# Patient Record
Sex: Female | Born: 1980 | Race: Black or African American | Hispanic: No | Marital: Married | Smoking: Former smoker
Health system: Southern US, Community
[De-identification: ages and names within clinical notes are randomized; demographics above are authoritative.]

## PROBLEM LIST (undated history)

## (undated) DIAGNOSIS — R519 Headache, unspecified: Secondary | ICD-10-CM

## (undated) DIAGNOSIS — D649 Anemia, unspecified: Secondary | ICD-10-CM

## (undated) DIAGNOSIS — R51 Headache: Secondary | ICD-10-CM

## (undated) HISTORY — DX: Anemia, unspecified: D64.9

## (undated) HISTORY — PX: HAND DEBRIDEMENT: SHX974

---

## 2010-03-01 ENCOUNTER — Emergency Department (HOSPITAL_COMMUNITY)
Admission: EM | Admit: 2010-03-01 | Discharge: 2010-03-01 | Disposition: A | Discharge status: Home / Self Care | Payer: Medicaid Other | Attending: Emergency Medicine | Admitting: Emergency Medicine

## 2010-03-01 DIAGNOSIS — R112 Nausea with vomiting, unspecified: Secondary | ICD-10-CM | POA: Insufficient documentation

## 2010-03-01 DIAGNOSIS — A5901 Trichomonal vulvovaginitis: Secondary | ICD-10-CM | POA: Insufficient documentation

## 2010-03-01 DIAGNOSIS — O99891 Other specified diseases and conditions complicating pregnancy: Secondary | ICD-10-CM | POA: Insufficient documentation

## 2010-03-01 LAB — URINALYSIS, ROUTINE W REFLEX MICROSCOPIC
Bilirubin Urine: NEGATIVE
Ketones, ur: NEGATIVE mg/dL
Nitrite: NEGATIVE
Urobilinogen, UA: 0.2 mg/dL (ref 0.0–1.0)
pH: 6 (ref 5.0–8.0)

## 2010-03-01 LAB — POCT I-STAT, CHEM 8
Calcium, Ion: 1.13 mmol/L (ref 1.12–1.32)
Chloride: 105 mEq/L (ref 96–112)
Glucose, Bld: 65 mg/dL — ABNORMAL LOW (ref 70–99)
HCT: 40 % (ref 36.0–46.0)
Hemoglobin: 13.6 g/dL (ref 12.0–15.0)
Potassium: 3.4 mEq/L — ABNORMAL LOW (ref 3.5–5.1)

## 2010-03-01 LAB — WET PREP, GENITAL
Clue Cells Wet Prep HPF POC: NONE SEEN
Yeast Wet Prep HPF POC: NONE SEEN

## 2010-03-01 LAB — URINE MICROSCOPIC-ADD ON

## 2010-03-01 LAB — POCT PREGNANCY, URINE: Preg Test, Ur: POSITIVE

## 2010-03-02 LAB — URINE CULTURE

## 2010-03-02 LAB — GC/CHLAMYDIA PROBE AMP, GENITAL
Chlamydia, DNA Probe: NEGATIVE
GC Probe Amp, Genital: NEGATIVE

## 2010-03-14 ENCOUNTER — Emergency Department (HOSPITAL_COMMUNITY)
Admission: EM | Admit: 2010-03-14 | Discharge: 2010-03-15 | Disposition: A | Discharge status: Home / Self Care | Payer: Medicaid Other | Attending: Emergency Medicine | Admitting: Emergency Medicine

## 2010-03-14 DIAGNOSIS — O219 Vomiting of pregnancy, unspecified: Secondary | ICD-10-CM | POA: Insufficient documentation

## 2010-03-15 LAB — URINE MICROSCOPIC-ADD ON

## 2010-03-15 LAB — POCT I-STAT, CHEM 8
BUN: <3 mg/dL — ABNORMAL LOW (ref 6–23)
Calcium, Ion: 1.25 mmol/L (ref 1.12–1.32)
Chloride: 103 mEq/L (ref 96–112)
Creatinine, Ser: 0.9 mg/dL (ref 0.4–1.2)
Glucose, Bld: 88 mg/dL (ref 70–99)
HCT: 38 % (ref 36.0–46.0)
Hemoglobin: 12.9 g/dL (ref 12.0–15.0)
Potassium: 3.9 mEq/L (ref 3.5–5.1)
Sodium: 137 mEq/L (ref 135–145)
TCO2: 22 mmol/L (ref 0–100)

## 2010-03-15 LAB — URINALYSIS, ROUTINE W REFLEX MICROSCOPIC
Bilirubin Urine: NEGATIVE
Glucose, UA: NEGATIVE mg/dL
Hgb urine dipstick: NEGATIVE
Ketones, ur: NEGATIVE mg/dL
Nitrite: NEGATIVE
Protein, ur: 30 mg/dL — AB
Specific Gravity, Urine: 1.023 (ref 1.005–1.030)
Urobilinogen, UA: 1 mg/dL (ref 0.0–1.0)
pH: 7 (ref 5.0–8.0)

## 2010-03-15 LAB — HCG, QUANTITATIVE, PREGNANCY: hCG, Beta Chain, Quant, S: 151827 m[IU]/mL — ABNORMAL HIGH (ref <5)

## 2010-05-12 LAB — ANTIBODY SCREEN: Antibody Screen: NEGATIVE

## 2010-05-12 LAB — HIV ANTIBODY (ROUTINE TESTING W REFLEX): HIV: NONREACTIVE

## 2010-05-12 LAB — RUBELLA ANTIBODY, IGM: Rubella: IMMUNE

## 2010-05-12 LAB — ABO/RH

## 2010-05-17 LAB — STREP B DNA PROBE: GBS: NEGATIVE

## 2010-06-03 ENCOUNTER — Emergency Department (HOSPITAL_COMMUNITY)
Admission: EM | Admit: 2010-06-03 | Discharge: 2010-06-03 | Disposition: A | Discharge status: Home / Self Care | Payer: Medicaid Other | Attending: Emergency Medicine | Admitting: Emergency Medicine

## 2010-06-03 DIAGNOSIS — O9989 Other specified diseases and conditions complicating pregnancy, childbirth and the puerperium: Secondary | ICD-10-CM | POA: Insufficient documentation

## 2010-06-03 DIAGNOSIS — R1013 Epigastric pain: Secondary | ICD-10-CM | POA: Insufficient documentation

## 2010-06-03 LAB — URINALYSIS, ROUTINE W REFLEX MICROSCOPIC
Bilirubin Urine: NEGATIVE
Glucose, UA: NEGATIVE mg/dL
Hgb urine dipstick: NEGATIVE
Protein, ur: NEGATIVE mg/dL
Specific Gravity, Urine: 1.007 (ref 1.005–1.030)

## 2010-10-13 NOTE — H&P (Signed)
[name], [name]           ACCOUNT NO.:  [id number]  MEDICAL RECORD NO.:  [id number]  LOCATION:  [facility]                         FACILITY:  [facility]  PHYSICIAN:  Janine Limbo, M.D.DATE OF BIRTH:  Apr 26, 1980  DATE OF ADMISSION:  06/03/2010 DATE OF DISCHARGE:  06/03/2010                             HISTORY & PHYSICAL   HISTORY OF PRESENT ILLNESS:  [name] is a 30 year old female, gravida 4, para 3-0-0-3, who presents at 40 weeks and 2-day gestation (EDC is October 12, 2010).  The patient has been followed at the [facility] of [facility] for women.  The pregnancy has been largely uncomplicated.  She presents for induction of labor.  OBSTETRICAL HISTORY:  The patient has had three vaginal deliveries at term.  Her heaviest infant weighed 7 pounds and 3 ounces.  DRUG ALLERGIES:  No known drug allergies.  PAST MEDICAL HISTORY:  The patient has a history of varicose veins.  She has an increased body mass index.  She has a history of having surgery on her hand where a splinter was embedded deep in her nerve and tissue.  SOCIAL HISTORY:  The patient is a past cigarette smoker, but she stopped smoking when she found out that she was pregnant.  She denies alcohol use and other recreational drug uses.  REVIEW OF SYSTEMS:  Normal pregnancy complaints.  FAMILY HISTORY:  Noncontributory.  PHYSICAL EXAMINATION:  VITAL SIGNS:  Height is 5 feet 6 inches, weight is 199 pounds. HEENT: Within normal limits. CHEST:  Clear. HEART:  Regular rate and rhythm. CHEST:  Her breasts are without masses. ABDOMEN:  Gravid with fundal height of 39 cm. EXTREMITIES:  Grossly normal. NEUROLOGIC:  Grossly normal. PELVIC:  Cervix is 2-3 cm dilated, 50% effaced, and -2 in station.  LABORATORY VALUES:  Blood type is O+, antibody screen negative, rubella is immune.  HBsAg  is negative.  HIV is nonreactive.  Sickle cell is negative.  Third trimester  beta strep is negative.  ASSESSMENT: 1. A 40-week and 2-day gestation. 2. Maternal fatigue with desire for induction of labor. 3. Multiparous patient.  PLAN:  The patient will undergo induction of labor.  She understands the indications for her procedure as well as the alternative treatment options.  We will give the patient Pitocin and plan to rupture her membranes once she starts to labor.     Janine Limbo, M.D.     AVS/MEDQ  D:  10/13/2010  T:  10/13/2010  Job:  [phone] [phone]

## 2010-10-14 ENCOUNTER — Encounter (HOSPITAL_COMMUNITY): Payer: Self-pay | Admitting: Anesthesiology

## 2010-10-14 ENCOUNTER — Inpatient Hospital Stay (HOSPITAL_COMMUNITY)
Admission: RE | Admit: 2010-10-14 | Discharge: 2010-10-16 | DRG: 775 | Disposition: A | Discharge status: Home / Self Care | Payer: Medicaid Other | Source: Ambulatory Visit | Attending: Obstetrics and Gynecology | Admitting: Obstetrics and Gynecology

## 2010-10-14 ENCOUNTER — Encounter (HOSPITAL_COMMUNITY): Payer: Self-pay

## 2010-10-14 ENCOUNTER — Inpatient Hospital Stay (HOSPITAL_COMMUNITY): Payer: Medicaid Other | Admitting: Anesthesiology

## 2010-10-14 DIAGNOSIS — O9903 Anemia complicating the puerperium: Secondary | ICD-10-CM | POA: Diagnosis not present

## 2010-10-14 DIAGNOSIS — O480 Post-term pregnancy: Principal | ICD-10-CM | POA: Diagnosis present

## 2010-10-14 DIAGNOSIS — D649 Anemia, unspecified: Secondary | ICD-10-CM | POA: Diagnosis not present

## 2010-10-14 LAB — CBC
HCT: 30.9 % — ABNORMAL LOW (ref 36.0–46.0)
Hemoglobin: 10.2 g/dL — ABNORMAL LOW (ref 12.0–15.0)
MCH: 28.9 pg (ref 26.0–34.0)
MCHC: 33 g/dL (ref 30.0–36.0)
MCV: 87.5 fL (ref 78.0–100.0)
Platelets: 170 10*3/uL (ref 150–400)
RBC: 3.53 MIL/uL — ABNORMAL LOW (ref 3.87–5.11)
RDW: 13.6 % (ref 11.5–15.5)
WBC: 5.8 10*3/uL (ref 4.0–10.5)

## 2010-10-14 MED ORDER — IBUPROFEN 600 MG PO TABS: 600.0000 mg | ORAL_TABLET | Freq: Four times a day (QID) | ORAL | Status: DC | PRN

## 2010-10-14 MED ORDER — EPHEDRINE 5 MG/ML INJ
10.0000 mg | INTRAVENOUS | Status: DC | PRN
  Filled 2010-10-14: qty 4

## 2010-10-14 MED ORDER — LACTATED RINGERS IV SOLN
INTRAVENOUS | Status: DC
  Administered 2010-10-14: 125 mL/h via INTRAVENOUS
  Administered 2010-10-14: 22:00:00 via INTRAVENOUS

## 2010-10-14 MED ORDER — ONDANSETRON HCL 4 MG/2ML IJ SOLN: 4.0000 mg | Freq: Four times a day (QID) | INTRAMUSCULAR | Status: DC | PRN

## 2010-10-14 MED ORDER — OXYTOCIN 20 UNITS IN LACTATED RINGERS INFUSION - SIMPLE
125.0000 mL/h | Freq: Once | INTRAVENOUS | Status: DC
  Administered 2010-10-14: 125 mL/h via INTRAVENOUS
  Filled 2010-10-14: qty 1000

## 2010-10-14 MED ORDER — EPHEDRINE 5 MG/ML INJ
10.0000 mg | INTRAVENOUS | Status: DC | PRN
  Filled 2010-10-14 (×2): qty 4

## 2010-10-14 MED ORDER — OXYTOCIN BOLUS FROM INFUSION
500.0000 mL | Freq: Once | INTRAVENOUS | Status: DC
  Filled 2010-10-14: qty 500

## 2010-10-14 MED ORDER — LIDOCAINE HCL (PF) 1 % IJ SOLN
30.0000 mL | INTRAMUSCULAR | Status: DC | PRN
  Filled 2010-10-14 (×2): qty 30

## 2010-10-14 MED ORDER — OXYCODONE-ACETAMINOPHEN 5-325 MG PO TABS: 2.0000 | ORAL_TABLET | ORAL | Status: DC | PRN

## 2010-10-14 MED ORDER — LIDOCAINE HCL 1.5 % IJ SOLN
INTRAMUSCULAR | Status: DC | PRN
  Administered 2010-10-14 (×2): 5 mL via EPIDURAL

## 2010-10-14 MED ORDER — PHENYLEPHRINE 40 MCG/ML (10ML) SYRINGE FOR IV PUSH (FOR BLOOD PRESSURE SUPPORT)
80.0000 ug | PREFILLED_SYRINGE | INTRAVENOUS | Status: DC | PRN
  Filled 2010-10-14 (×2): qty 5

## 2010-10-14 MED ORDER — ACETAMINOPHEN 325 MG PO TABS: 650.0000 mg | ORAL_TABLET | ORAL | Status: DC | PRN

## 2010-10-14 MED ORDER — PHENYLEPHRINE 40 MCG/ML (10ML) SYRINGE FOR IV PUSH (FOR BLOOD PRESSURE SUPPORT)
80.0000 ug | PREFILLED_SYRINGE | INTRAVENOUS | Status: DC | PRN
  Filled 2010-10-14: qty 5

## 2010-10-14 MED ORDER — DIPHENHYDRAMINE HCL 50 MG/ML IJ SOLN: 12.5000 mg | INTRAMUSCULAR | Status: DC | PRN

## 2010-10-14 MED ORDER — LACTATED RINGERS IV SOLN: INTRAVENOUS | Status: DC

## 2010-10-14 MED ORDER — FENTANYL 2.5 MCG/ML BUPIVACAINE 1/10 % EPIDURAL INFUSION (WH - ANES)
14.0000 mL/h | INTRAMUSCULAR | Status: DC
  Administered 2010-10-14: 14 mL/h via EPIDURAL
  Filled 2010-10-14: qty 60

## 2010-10-14 MED ORDER — CITRIC ACID-SODIUM CITRATE 334-500 MG/5ML PO SOLN: 30.0000 mL | ORAL | Status: DC | PRN

## 2010-10-14 MED ORDER — LACTATED RINGERS IV SOLN
500.0000 mL | INTRAVENOUS | Status: DC | PRN
  Administered 2010-10-14: 500 mL via INTRAVENOUS

## 2010-10-14 MED ORDER — FLEET ENEMA 7-19 GM/118ML RE ENEM: 1.0000 | ENEMA | RECTAL | Status: DC | PRN

## 2010-10-14 MED ORDER — LACTATED RINGERS IV SOLN
500.0000 mL | Freq: Once | INTRAVENOUS | Status: AC
  Administered 2010-10-14: 500 mL via INTRAVENOUS

## 2010-10-14 NOTE — Progress Notes (Signed)
Cx: 2-3/75/-1 FHT: Cat 1 Pt wants epidural. Then will ROM.  Leonard Schwartz, M.D.

## 2010-10-14 NOTE — ED Notes (Signed)
Offered to let pt walk in hall pt declinded at this time.

## 2010-10-14 NOTE — Anesthesia Preprocedure Evaluation (Signed)
Anesthesia Evaluation  Name, MR# and DOB Patient awake  General Assessment Comment  Reviewed: Allergy & Precautions, H&P , Patient's Chart, lab work & pertinent test results  Airway Mallampati: II TM Distance: >3 FB Neck ROM: full    Dental No notable dental hx.    Pulmonary  clear to auscultation  Pulmonary exam normal       Cardiovascular regular Normal    Neuro/Psych Negative Neurological ROS  Negative Psych ROS   GI/Hepatic negative GI ROS Neg liver ROS    Endo/Other  Negative Endocrine ROS  Renal/GU negative Renal ROS     Musculoskeletal   Abdominal   Peds  Hematology negative hematology ROS (+)   Anesthesia Other Findings   Reproductive/Obstetrics (+) Pregnancy                           Anesthesia Physical Anesthesia Plan  ASA: II  Anesthesia Plan: Epidural   Post-op Pain Management:    Induction:   Airway Management Planned:   Additional Equipment:   Intra-op Plan:   Post-operative Plan:   Informed Consent: I have reviewed the patients History and Physical, chart, labs and discussed the procedure including the risks, benefits and alternatives for the proposed anesthesia with the patient or authorized representative who has indicated his/her understanding and acceptance.     Plan Discussed with:   Anesthesia Plan Comments:         Anesthesia Quick Evaluation  

## 2010-10-14 NOTE — Op Note (Signed)
Delivery Note At 10:38 PM a viable and healthy female (heaven) was delivered via Vaginal, Spontaneous Delivery (Presentation: Left Occiput Anterior).  APGAR: 9, 9; weight 8 lb 6.2 oz (3805 g).   Placenta status: Intact, Spontaneous.  Cord: 3 vessels with the following complications: None.  Anesthesia: Epidural  Episiotomy: None Lacerations: None Suture Repair: NA Est. Blood Loss (mL): 300   Mom to postpartum.   Baby A to nursery-stable.   Baby B to NA.  Kirkland Hun V 10/14/2010, 10:57 PM

## 2010-10-14 NOTE — ED Notes (Signed)
Pt request to walk now.. Pt off monito to walk in hall x 1 hr. Notified CNM that pt is walking

## 2010-10-14 NOTE — Progress Notes (Signed)
Notified FHR tracing is reative. Pt reports ctxs feel like they are slowwing down. Order to let pt walk in hall if she wanted.

## 2010-10-14 NOTE — Anesthesia Procedure Notes (Signed)

## 2010-10-14 NOTE — Progress Notes (Signed)
S: pt c/o 8-45min ctx that are getting stronger, denies need for pain meds at this time, pt is sched for IOL today 2nd to PD O: vss, FHR 130, not reactive at this time, minimal variability, no decels CAT II toco - not tracing well SVE 2/50/-2  A:  Term IUP awaiting IOL Probable early labor Non reactive NST, overall reassuring  P: CTO Consider IVF's for FHR if necessary DR Stefano Gaul updated

## 2010-10-14 NOTE — Progress Notes (Signed)
Patient is comfortable with epidural. FHT Cat 1 Cx 5/80/0, vertex AROM clear  Leonard Schwartz, MD

## 2010-10-14 NOTE — ED Notes (Signed)
Pt back from walking. L&D called and said there was a room available for her. Pt admittted to room 169

## 2010-10-14 NOTE — Progress Notes (Signed)
Pt reports started feeling ctx around 8am . Coming q 10 min. Due for induction today.

## 2010-10-15 LAB — CBC
HCT: 29.3 % — ABNORMAL LOW (ref 36.0–46.0)
Hemoglobin: 9.7 g/dL — ABNORMAL LOW (ref 12.0–15.0)
MCH: 29.1 pg (ref 26.0–34.0)
MCV: 88 fL (ref 78.0–100.0)
RBC: 3.33 MIL/uL — ABNORMAL LOW (ref 3.87–5.11)

## 2010-10-15 LAB — RPR: RPR Ser Ql: NONREACTIVE

## 2010-10-15 LAB — ABO/RH: ABO/RH(D): O POS

## 2010-10-15 MED ORDER — TETANUS-DIPHTH-ACELL PERTUSSIS 5-2.5-18.5 LF-MCG/0.5 IM SUSP: 0.5000 mL | Freq: Once | INTRAMUSCULAR | Status: DC

## 2010-10-15 MED ORDER — DIPHENHYDRAMINE HCL 25 MG PO CAPS: 25.0000 mg | ORAL_CAPSULE | Freq: Four times a day (QID) | ORAL | Status: DC | PRN

## 2010-10-15 MED ORDER — MEDROXYPROGESTERONE ACETATE 150 MG/ML IM SUSP: 150.0000 mg | INTRAMUSCULAR | Status: DC | PRN

## 2010-10-15 MED ORDER — MEASLES, MUMPS & RUBELLA VAC ~~LOC~~ INJ
0.5000 mL | INJECTION | Freq: Once | SUBCUTANEOUS | Status: DC
  Filled 2010-10-15: qty 0.5

## 2010-10-15 MED ORDER — ONDANSETRON HCL 4 MG PO TABS: 4.0000 mg | ORAL_TABLET | ORAL | Status: DC | PRN

## 2010-10-15 MED ORDER — IBUPROFEN 600 MG PO TABS
600.0000 mg | ORAL_TABLET | Freq: Four times a day (QID) | ORAL | Status: DC
  Administered 2010-10-15 – 2010-10-16 (×5): 600 mg via ORAL
  Filled 2010-10-15 (×5): qty 1

## 2010-10-15 MED ORDER — ONDANSETRON HCL 4 MG/2ML IJ SOLN: 4.0000 mg | INTRAMUSCULAR | Status: DC | PRN

## 2010-10-15 MED ORDER — SIMETHICONE 80 MG PO CHEW: 80.0000 mg | CHEWABLE_TABLET | ORAL | Status: DC | PRN

## 2010-10-15 MED ORDER — BENZOCAINE-MENTHOL 20-0.5 % EX AERO: 1.0000 "application " | INHALATION_SPRAY | CUTANEOUS | Status: DC | PRN

## 2010-10-15 MED ORDER — PRENATAL PLUS 27-1 MG PO TABS
1.0000 | ORAL_TABLET | Freq: Every day | ORAL | Status: DC
  Administered 2010-10-15 – 2010-10-16 (×2): 1 via ORAL
  Filled 2010-10-15 (×2): qty 1

## 2010-10-15 MED ORDER — LANOLIN HYDROUS EX OINT: TOPICAL_OINTMENT | CUTANEOUS | Status: DC | PRN

## 2010-10-15 MED ORDER — WITCH HAZEL-GLYCERIN EX PADS: 1.0000 "application " | MEDICATED_PAD | CUTANEOUS | Status: DC | PRN

## 2010-10-15 MED ORDER — DIBUCAINE 1 % RE OINT: 1.0000 "application " | TOPICAL_OINTMENT | RECTAL | Status: DC | PRN

## 2010-10-15 MED ORDER — OXYCODONE-ACETAMINOPHEN 5-325 MG PO TABS
1.0000 | ORAL_TABLET | ORAL | Status: DC | PRN
  Administered 2010-10-15: 1 via ORAL
  Filled 2010-10-15: qty 1

## 2010-10-15 MED ORDER — ZOLPIDEM TARTRATE 5 MG PO TABS: 5.0000 mg | ORAL_TABLET | Freq: Every evening | ORAL | Status: DC | PRN

## 2010-10-15 MED ORDER — SENNOSIDES-DOCUSATE SODIUM 8.6-50 MG PO TABS
2.0000 | ORAL_TABLET | Freq: Every day | ORAL | Status: DC
Start: 2010-10-15 — End: 2010-10-16
  Administered 2010-10-15: 2 via ORAL

## 2010-10-15 NOTE — Progress Notes (Signed)
Post Partum Day 1 Subjective: no complaints.  Up ad lib.  Undecided re: contraception.  Bottlefeeding.  Objective: Blood pressure 101/62, pulse 71, temperature 97.6 F (36.4 C), temperature source Oral, resp. rate 16, height 5\' 7"  (1.702 m), weight 91.445 kg (201 lb 9.6 oz), unknown if currently breastfeeding.  Physical Exam:  General: alert Lochia: appropriate Uterine Fundus: firm Incision: Perineum clear DVT Evaluation: No evidence of DVT seen on physical exam. Negative Homan's sign.   Basename 10/15/10 0540 10/14/10 1730  HGB 9.7* 10.2*  HCT 29.3* 30.9*    Assessment/Plan: Plan for discharge tomorrow   LOS: 1 day   Theresa Banks 10/15/2010, 8:16 AM

## 2010-10-15 NOTE — Anesthesia Postprocedure Evaluation (Signed)
Anesthesia Post Note  Patient: Theresa Banks  Procedure(s) Performed: * No procedures listed *  Anesthesia type: Epidural  Patient location: Mother/Baby  Post pain: Pain level controlled  Post assessment: Post-op Vital signs reviewed  Last Vitals:  Filed Vitals:   10/15/10 0617  BP: 101/62  Pulse: 71  Temp: 97.6 F (36.4 C)  Resp: 16    Post vital signs: Reviewed  Level of consciousness: awake  Complications: No apparent anesthesia complications

## 2010-10-15 NOTE — Addendum Note (Signed)
Addendum  created 10/15/10 1600 by Veanna Dower   Modules edited:Charges VN, Notes Section    

## 2010-10-15 NOTE — Anesthesia Postprocedure Evaluation (Signed)
  Anesthesia Post-op Note  Patient: Theresa Banks  Procedure(s) Performed: * No procedures listed *  Patient Location: PACU and Mother/Baby  Anesthesia Type: Epidural  Level of Consciousness: awake, alert  and oriented  Airway and Oxygen Therapy: Patient Spontanous Breathing  Post-op Pain: none  Post-op Assessment: Post-op Vital signs reviewed and Patient's Cardiovascular Status Stable  Post-op Vital Signs: Reviewed and stable  Complications: No apparent anesthesia complications

## 2010-10-15 NOTE — Addendum Note (Signed)
Addendum  created 10/15/10 1600 by Cephus Shelling   Modules edited:Charges VN, Notes Section

## 2010-10-15 NOTE — Progress Notes (Signed)
UR chart review completed.  

## 2010-10-16 MED ORDER — FERROUS SULFATE 325 (65 FE) MG PO TABS: 325.0000 mg | ORAL_TABLET | Freq: Every day | ORAL | Status: DC

## 2010-10-16 MED ORDER — IBUPROFEN 600 MG PO TABS: 600.0000 mg | ORAL_TABLET | Freq: Four times a day (QID) | ORAL | Status: AC

## 2010-10-16 MED ORDER — OXYCODONE-ACETAMINOPHEN 5-325 MG PO TABS: 1.0000 | ORAL_TABLET | Freq: Four times a day (QID) | ORAL | Status: AC | PRN

## 2010-10-16 NOTE — Progress Notes (Signed)
Post Partum Day 2 Subjective: no complaints.  Pt reports she is ambulating, voiding and tolerating po liquids and solids without difficulty.  No BM but passing flatus.  Is bottle feeding.  Denies dizziness or weakness.  Having some cramping with some relief with motrin but has used Percocet for improved results.    Objective: Blood pressure 142/79, pulse 89, temperature 98.4 F (36.9 C), temperature source Oral, resp. rate 18, height 5\' 7"  (1.702 m), weight 91.445 kg (201 lb 9.6 oz), unknown if currently breastfeeding. Filed Vitals:   10/15/10 0617 10/15/10 1348 10/15/10 2129 10/16/10 0604  BP: 101/62 114/70 122/72 142/79  Pulse: 71 73 73 89  Temp: 97.6 F (36.4 C) 98.1 F (36.7 C) 97.5 F (36.4 C) 98.4 F (36.9 C)  TempSrc: Oral Oral Oral Oral  Resp: 16 20 18 18   Height:      Weight:       Physical Exam:  General: alert, cooperative and no distress Heart:  RRR Lungs:  Clear to a/p auscultation Breasts:  Soft Abdomen:  Soft, nontender with positive BS x 4 quads Lochia: appropriate Uterine Fundus: firm, 1 below umbilicus Incision: N/A DVT Evaluation: No evidence of DVT seen on physical exam. Negative Homan's sign bilaterally. No significant calf/ankle edema.   Basename 10/15/10 0540 10/14/10 1730  HGB 9.7* 10.2*  HCT 29.3* 30.9*    Assessment/Plan: Stable s/p vaginal delivery Asymptomatic postpartum anemia  Discharge to home Rx Motrin, Percocet, Iron Discharge instructions reviewed. Pt plans Implanon for contraception. RTO 6wks for f/u.       LOS: 2 days   Tab Rylee O. 10/16/2010, 9:12 AM

## 2010-10-16 NOTE — Discharge Summary (Signed)
Obstetric Discharge Summary Reason for Admission: onset of labor Prenatal Procedures: ultrasound Intrapartum Procedures: spontaneous vaginal delivery Postpartum Procedures: none Complications-Operative and Postpartum: none Hemoglobin  Date Value Range Status  10/15/2010 9.7* 12.0-15.0 (g/dL) Final     HCT  Date Value Range Status  10/15/2010 29.3* 36.0-46.0 (%) Final    Discharge Diagnoses: Term Pregnancy-delivered  Discharge Information: Date: 10/16/2010 Activity: unrestricted Diet: routine Medications: PNV, Ibuprofen, Iron and Percocet Condition: stable Instructions: refer to practice specific booklet Discharge to: home Contraception:  Desires Implanon Follow-up Information    Follow up with [facility] in 6 weeks.         Newborn Data: Live born female  Birth Weight: 8 lb 6.2 oz (3805 g) APGAR: 9, 9  Home with mother.  Theresa Fitzpatrick O. 10/16/2010, 9:22 AM

## 2012-02-25 ENCOUNTER — Emergency Department (HOSPITAL_COMMUNITY)
Admission: EM | Admit: 2012-02-25 | Discharge: 2012-02-25 | Disposition: A | Discharge status: Home / Self Care | Payer: Self-pay | Attending: Emergency Medicine | Admitting: Emergency Medicine

## 2012-02-25 ENCOUNTER — Encounter (HOSPITAL_COMMUNITY): Payer: Self-pay | Admitting: *Deleted

## 2012-02-25 DIAGNOSIS — J029 Acute pharyngitis, unspecified: Secondary | ICD-10-CM | POA: Insufficient documentation

## 2012-02-25 DIAGNOSIS — R0982 Postnasal drip: Secondary | ICD-10-CM | POA: Insufficient documentation

## 2012-02-25 DIAGNOSIS — R059 Cough, unspecified: Secondary | ICD-10-CM | POA: Insufficient documentation

## 2012-02-25 DIAGNOSIS — J0100 Acute maxillary sinusitis, unspecified: Secondary | ICD-10-CM | POA: Insufficient documentation

## 2012-02-25 DIAGNOSIS — R05 Cough: Secondary | ICD-10-CM | POA: Insufficient documentation

## 2012-02-25 DIAGNOSIS — R509 Fever, unspecified: Secondary | ICD-10-CM | POA: Insufficient documentation

## 2012-02-25 MED ORDER — CETIRIZINE-PSEUDOEPHEDRINE ER 5-120 MG PO TB12: 1.0000 | ORAL_TABLET | Freq: Two times a day (BID) | ORAL | Status: DC

## 2012-02-25 MED ORDER — AMOXICILLIN 500 MG PO CAPS: 500.0000 mg | ORAL_CAPSULE | Freq: Three times a day (TID) | ORAL | Status: DC

## 2012-02-25 MED ORDER — PROMETHAZINE-CODEINE 6.25-10 MG/5ML PO SYRP: 5.0000 mL | ORAL_SOLUTION | ORAL | Status: DC | PRN

## 2012-02-25 NOTE — ED Provider Notes (Signed)
History     CSN: [id number]  Arrival date & time 02/25/12  6962   First MD Initiated Contact with Patient 02/25/12 [phone]      Chief Complaint  Patient presents with  . URI    (Consider location/radiation/quality/duration/timing/severity/associated sxs/prior treatment) HPI Comments: Theresa Banks is a 32 y.o. Female presenting with a 5 day history of uri type symptoms including nasal congestion and rhinorrhea,  Post nasal drip,  Sore throat,  Hoarse voice and coughing which is sometimes productive of green sputum.  She has been drinking tea and taking mucinex with no relief of symtpoms.  Yesterday she developed chills and subjective fever and aching pain in her right cheek and right upper teeth.   She denies shortness of breath,  Chest pain and headache.     The history is provided by the patient.    History reviewed. No pertinent past medical history.  Past Surgical History  Procedure Laterality Date  . Hand debridement      History reviewed. No pertinent family history.  History  Substance Use Topics  . Smoking status: Never Smoker   . Smokeless tobacco: Not on file  . Alcohol Use: No    OB History   Grav Para Term Preterm Abortions TAB SAB Ect Mult Living   4 4 4  0 0 0 0  0 4      Review of Systems  Constitutional: Positive for fever and chills.  HENT: Positive for congestion, sore throat, rhinorrhea, postnasal drip and sinus pressure. Negative for neck pain.   Eyes: Negative.   Respiratory: Positive for cough. Negative for chest tightness and shortness of breath.   Cardiovascular: Negative for chest pain.  Gastrointestinal: Negative for nausea and abdominal pain.  Genitourinary: Negative.   Musculoskeletal: Negative for joint swelling and arthralgias.  Skin: Negative.  Negative for rash and wound.  Neurological: Negative for dizziness, weakness, light-headedness, numbness and headaches.  Psychiatric/Behavioral: Negative.     Allergies  Review of  patient's allergies indicates no known allergies.  Home Medications   Current Outpatient Rx  Name  Route  Sig  Dispense  Refill  . GuaiFENesin (MUCINEX PO)   Oral   Take 15 mLs by mouth 3 (three) times daily as needed (cough & congestion).         Marland Kitchen amoxicillin (AMOXIL) 500 MG capsule   Oral   Take 1 capsule (500 mg total) by mouth 3 (three) times daily.   30 capsule   0   . cetirizine-pseudoephedrine (ZYRTEC-D) 5-120 MG per tablet   Oral   Take 1 tablet by mouth 2 (two) times daily.   14 tablet   0   . promethazine-codeine (PHENERGAN WITH CODEINE) 6.25-10 MG/5ML syrup   Oral   Take 5 mLs by mouth every 4 (four) hours as needed for cough.   120 mL   0     BP 131/76  Pulse 78  Temp(Src) 98.6 F (37 C) (Oral)  Resp 20  SpO2 98%  LMP 01/04/2012  Physical Exam  Constitutional: She is oriented to person, place, and time. She appears well-developed and well-nourished.  HENT:  Head: Normocephalic and atraumatic.  Right Ear: Tympanic membrane and ear canal normal.  Left Ear: Tympanic membrane and ear canal normal.  Nose: Mucosal edema and rhinorrhea present. Right sinus exhibits maxillary sinus tenderness.  Mouth/Throat: Uvula is midline, oropharynx is clear and moist and mucous membranes are normal. No oropharyngeal exudate, posterior oropharyngeal edema, posterior oropharyngeal erythema or tonsillar abscesses.  Eyes: Conjunctivae are normal.  Cardiovascular: Normal rate and normal heart sounds.   Pulmonary/Chest: Effort normal. No respiratory distress. She has no wheezes. She has no rales. She exhibits no tenderness.  Abdominal: Soft. There is no tenderness.  Musculoskeletal: Normal range of motion.  Neurological: She is alert and oriented to person, place, and time.  Skin: Skin is warm and dry. No rash noted.  Psychiatric: She has a normal mood and affect.    ED Course  Procedures (including critical care time)  Labs Reviewed - No data to display No results  found.   1. Sinusitis, acute maxillary       MDM  URI sx which have evolved to right maxillary sinusitis. Pt encouraged rest, increase fluid intake and continue mucinex.  Amoxil prescribed,  Zyrtec D for congestion and drainage,  Phenergan with codeine for cough and rest.  Encouraged recheck for worsened sx.  Referrals given.        Burgess Amor, PA 02/25/12 1012

## 2012-02-25 NOTE — ED Provider Notes (Signed)
Medical screening examination/treatment/procedure(s) were performed by non-physician practitioner and as supervising physician I was immediately available for consultation/collaboration.   Bransyn Adami, MD 02/25/12 1544 

## 2012-02-25 NOTE — ED Notes (Signed)
Pt reports cold symptoms since Tuesday. Sore throat, chills, cough, runny nose. No distress noted at triage.

## 2012-04-26 ENCOUNTER — Emergency Department (HOSPITAL_COMMUNITY)
Admission: EM | Admit: 2012-04-26 | Discharge: 2012-04-26 | Disposition: A | Discharge status: Home / Self Care | Payer: Self-pay | Attending: Emergency Medicine | Admitting: Emergency Medicine

## 2012-04-26 ENCOUNTER — Encounter (HOSPITAL_COMMUNITY): Payer: Self-pay | Admitting: Emergency Medicine

## 2012-04-26 DIAGNOSIS — R630 Anorexia: Secondary | ICD-10-CM | POA: Insufficient documentation

## 2012-04-26 DIAGNOSIS — J020 Streptococcal pharyngitis: Secondary | ICD-10-CM | POA: Insufficient documentation

## 2012-04-26 DIAGNOSIS — R1310 Dysphagia, unspecified: Secondary | ICD-10-CM | POA: Insufficient documentation

## 2012-04-26 DIAGNOSIS — R599 Enlarged lymph nodes, unspecified: Secondary | ICD-10-CM | POA: Insufficient documentation

## 2012-04-26 DIAGNOSIS — R509 Fever, unspecified: Secondary | ICD-10-CM | POA: Insufficient documentation

## 2012-04-26 MED ORDER — HYDROCODONE-ACETAMINOPHEN 5-325 MG PO TABS: 1.0000 | ORAL_TABLET | Freq: Four times a day (QID) | ORAL | Status: DC | PRN

## 2012-04-26 MED ORDER — PENICILLIN G BENZATHINE 1200000 UNIT/2ML IM SUSP
1.2000 10*6.[IU] | Freq: Once | INTRAMUSCULAR | Status: AC
  Administered 2012-04-26: 1.2 10*6.[IU] via INTRAMUSCULAR
  Filled 2012-04-26: qty 2

## 2012-04-26 MED ORDER — IBUPROFEN 400 MG PO TABS
600.0000 mg | ORAL_TABLET | Freq: Once | ORAL | Status: AC
  Administered 2012-04-26: 600 mg via ORAL
  Filled 2012-04-26: qty 1

## 2012-04-26 MED ORDER — DEXAMETHASONE SODIUM PHOSPHATE 10 MG/ML IJ SOLN
10.0000 mg | Freq: Once | INTRAMUSCULAR | Status: AC
  Administered 2012-04-26: 10 mg via INTRAMUSCULAR
  Filled 2012-04-26: qty 1

## 2012-04-26 NOTE — ED Provider Notes (Signed)
History     CSN: [id number]  Arrival date & time 04/26/12  0000   First MD Initiated Contact with Patient 04/26/12 0130      Chief Complaint  Patient presents with  . Sore Throat    (Consider location/radiation/quality/duration/timing/severity/associated sxs/prior treatment) Patient is a 32 y.o. female presenting with pharyngitis. The history is provided by the patient.  Sore Throat This is a new problem. The current episode started in the past 7 days (2 days ago). The problem occurs constantly. The problem has been gradually worsening. Associated symptoms include anorexia, a fever and a sore throat. Pertinent negatives include no abdominal pain, arthralgias, change in bowel habit, chest pain, chills, congestion, coughing, diaphoresis, fatigue, headaches, joint swelling, myalgias, nausea, neck pain, numbness, rash, swollen glands, urinary symptoms, vertigo, visual change, vomiting or weakness. The symptoms are aggravated by drinking, eating and swallowing. She has tried nothing for the symptoms. The treatment provided no relief.    History reviewed. No pertinent past medical history.  Past Surgical History  Procedure Laterality Date  . Hand debridement      No family history on file.  History  Substance Use Topics  . Smoking status: Never Smoker   . Smokeless tobacco: Not on file  . Alcohol Use: No    OB History   Grav Para Term Preterm Abortions TAB SAB Ect Mult Living   4 4 4  0 0 0 0  0 4      Review of Systems  Constitutional: Positive for fever and appetite change. Negative for chills, diaphoresis, activity change and fatigue.  HENT: Positive for sore throat and trouble swallowing. Negative for congestion, facial swelling, rhinorrhea, sneezing, drooling, neck pain, neck stiffness, dental problem, voice change, postnasal drip and sinus pressure.   Eyes: Negative for visual disturbance.  Respiratory: Negative for cough, choking, shortness of breath, wheezing and  stridor.   Cardiovascular: Negative for chest pain.  Gastrointestinal: Positive for anorexia. Negative for nausea, vomiting, abdominal pain and change in bowel habit.  Musculoskeletal: Negative for myalgias, joint swelling and arthralgias.  Skin: Negative for rash.  Neurological: Negative for dizziness, vertigo, weakness, numbness and headaches.  Hematological: Positive for adenopathy. Does not bruise/bleed easily.  All other systems reviewed and are negative.    Allergies  Review of patient's allergies indicates no known allergies.  Home Medications  No current outpatient prescriptions on file.  BP 138/77  Pulse 105  Temp(Src) 99 F (37.2 C) (Oral)  Resp 20  SpO2 98%  LMP 04/06/2012  Physical Exam  Nursing note and vitals reviewed. Constitutional: She is oriented to person, place, and time. She appears well-developed and well-nourished. No distress.  HENT:  Head: Normocephalic and atraumatic. No trismus in the jaw.  Right Ear: Tympanic membrane, external ear and ear canal normal.  Left Ear: Tympanic membrane, external ear and ear canal normal.  Nose: Nose normal. No rhinorrhea. Right sinus exhibits no maxillary sinus tenderness and no frontal sinus tenderness. Left sinus exhibits no maxillary sinus tenderness and no frontal sinus tenderness.  Mouth/Throat: Uvula is midline and mucous membranes are normal. Normal dentition. No dental abscesses or edematous. Oropharyngeal exudate and posterior oropharyngeal edema present. No posterior oropharyngeal erythema or tonsillar abscesses.  No submental edema, tongue not elevated, no trismus. No impending airway obstruction; Pt able to speak full sentences, swallow intact, no drooling, stridor, or tonsillar/uvula displacement. No palatal petechia  Eyes: Conjunctivae are normal.  Neck: Trachea normal, normal range of motion and full passive range of motion without  pain. Neck supple. No rigidity. Normal range of motion present. No  Brudzinski's sign noted.  Flexion and extension of neck without pain or difficulty. Able to breath without difficulty in extension.  Cardiovascular: Normal rate and regular rhythm.   Pulmonary/Chest: Effort normal and breath sounds normal. No stridor. No respiratory distress. She has no wheezes.  Abdominal: Soft. There is no tenderness.  No obvious evidence of splenomegaly. Non ttp.   Musculoskeletal: Normal range of motion.  Lymphadenopathy:       Head (right side): No preauricular and no posterior auricular adenopathy present.       Head (left side): No preauricular and no posterior auricular adenopathy present.    She has cervical adenopathy.  Neurological: She is alert and oriented to person, place, and time.  Skin: Skin is warm and dry. No rash noted. She is not diaphoretic.  Psychiatric: She has a normal mood and affect.    ED Course  Procedures (including critical care time)  Labs Reviewed  RAPID STREP SCREEN - Abnormal; Notable for the following:    Streptococcus, Group A Screen (Direct) POSITIVE (*)    All other components within normal limits   No results found.   No diagnosis found.    MDM  Pt febrile with tonsillar exudate, cervical lymphadenopathy, & dysphagia; diagnosis of strep. Treated in the Ed with steroids, NSAIDs, Pain medication and PCN IM.  Pt appears mildly dehydrated, discussed importance of water rehydration. Presentation non concerning for PTA or infxn spread to soft tissue. No trismus or uvula deviation. Specific return precautions discussed. Pt able to drink water in ED without difficulty with intact air way. Recommended PCP follow up.          Jaci Carrel, [place] 04/26/12 0222

## 2012-04-26 NOTE — ED Notes (Signed)
PT. REPORTS SORE THROAT / SWELLING ONSET 2 DAYS AGO , RESPIRATIONS UNLABORED / AIRWAY INTACT , SLIGHT CHILLS, DENIES FEVER.

## 2012-04-26 NOTE — ED Provider Notes (Signed)
Medical screening examination/treatment/procedure(s) were performed by non-physician practitioner and as supervising physician I was immediately available for consultation/collaboration.  Eshani Maestre M Bryam Taborda, MD 04/26/12 0646 

## 2013-06-29 ENCOUNTER — Encounter (HOSPITAL_COMMUNITY): Payer: Self-pay | Admitting: Emergency Medicine

## 2013-06-29 ENCOUNTER — Emergency Department (HOSPITAL_COMMUNITY)
Admission: EM | Admit: 2013-06-29 | Discharge: 2013-06-29 | Disposition: A | Discharge status: Home / Self Care | Payer: No Typology Code available for payment source | Attending: Emergency Medicine | Admitting: Emergency Medicine

## 2013-06-29 DIAGNOSIS — L539 Erythematous condition, unspecified: Secondary | ICD-10-CM | POA: Insufficient documentation

## 2013-06-29 DIAGNOSIS — G8929 Other chronic pain: Secondary | ICD-10-CM

## 2013-06-29 DIAGNOSIS — R51 Headache: Secondary | ICD-10-CM

## 2013-06-29 DIAGNOSIS — F17200 Nicotine dependence, unspecified, uncomplicated: Secondary | ICD-10-CM | POA: Insufficient documentation

## 2013-06-29 DIAGNOSIS — R21 Rash and other nonspecific skin eruption: Secondary | ICD-10-CM

## 2013-06-29 DIAGNOSIS — R1110 Vomiting, unspecified: Secondary | ICD-10-CM | POA: Insufficient documentation

## 2013-06-29 DIAGNOSIS — R197 Diarrhea, unspecified: Secondary | ICD-10-CM | POA: Insufficient documentation

## 2013-06-29 DIAGNOSIS — Z3202 Encounter for pregnancy test, result negative: Secondary | ICD-10-CM | POA: Insufficient documentation

## 2013-06-29 HISTORY — DX: Headache: R51

## 2013-06-29 HISTORY — DX: Headache, unspecified: R51.9

## 2013-06-29 LAB — URINALYSIS, ROUTINE W REFLEX MICROSCOPIC
Bilirubin Urine: NEGATIVE
GLUCOSE, UA: NEGATIVE mg/dL
HGB URINE DIPSTICK: NEGATIVE
Ketones, ur: NEGATIVE mg/dL
LEUKOCYTES UA: NEGATIVE
Nitrite: NEGATIVE
PH: 7 (ref 5.0–8.0)
PROTEIN: NEGATIVE mg/dL
SPECIFIC GRAVITY, URINE: 1.025 (ref 1.005–1.030)
Urobilinogen, UA: 1 mg/dL (ref 0.0–1.0)

## 2013-06-29 LAB — COMPREHENSIVE METABOLIC PANEL
ALBUMIN: 4 g/dL (ref 3.5–5.2)
ALT: 8 U/L (ref 0–35)
AST: 15 U/L (ref 0–37)
Alkaline Phosphatase: 41 U/L (ref 39–117)
BUN: 5 mg/dL — ABNORMAL LOW (ref 6–23)
CALCIUM: 9 mg/dL (ref 8.4–10.5)
CO2: 23 meq/L (ref 19–32)
CREATININE: 0.71 mg/dL (ref 0.50–1.10)
Chloride: 104 mEq/L (ref 96–112)
GFR calc Af Amer: >90 mL/min (ref >90)
Glucose, Bld: 86 mg/dL (ref 70–99)
Potassium: 3.7 mEq/L (ref 3.7–5.3)
SODIUM: 139 meq/L (ref 137–147)
Total Bilirubin: 0.3 mg/dL (ref 0.3–1.2)
Total Protein: 7.2 g/dL (ref 6.0–8.3)

## 2013-06-29 LAB — CBC WITH DIFFERENTIAL/PLATELET
BASOS ABS: 0 10*3/uL (ref 0.0–0.1)
BASOS PCT: 0 % (ref 0–1)
EOS PCT: 4 % (ref 0–5)
Eosinophils Absolute: 0.2 10*3/uL (ref 0.0–0.7)
HCT: 35.6 % — ABNORMAL LOW (ref 36.0–46.0)
Hemoglobin: 11.8 g/dL — ABNORMAL LOW (ref 12.0–15.0)
LYMPHS PCT: 24 % (ref 12–46)
Lymphs Abs: 1.3 10*3/uL (ref 0.7–4.0)
MCH: 29.5 pg (ref 26.0–34.0)
MCHC: 33.1 g/dL (ref 30.0–36.0)
MCV: 89 fL (ref 78.0–100.0)
MONO ABS: 0.4 10*3/uL (ref 0.1–1.0)
Monocytes Relative: 7 % (ref 3–12)
NEUTROS ABS: 3.5 10*3/uL (ref 1.7–7.7)
Neutrophils Relative %: 65 % (ref 43–77)
PLATELETS: 235 10*3/uL (ref 150–400)
RBC: 4 MIL/uL (ref 3.87–5.11)
RDW: 12.7 % (ref 11.5–15.5)
WBC: 5.5 10*3/uL (ref 4.0–10.5)

## 2013-06-29 LAB — POC URINE PREG, ED: PREG TEST UR: NEGATIVE

## 2013-06-29 LAB — SEDIMENTATION RATE: Sed Rate: 3 mm/hr (ref 0–22)

## 2013-06-29 LAB — LIPASE, BLOOD: LIPASE: 16 U/L (ref 11–59)

## 2013-06-29 MED ORDER — NAPROXEN 500 MG PO TABS: 500.0000 mg | ORAL_TABLET | Freq: Two times a day (BID) | ORAL | Status: DC

## 2013-06-29 MED ORDER — KETOROLAC TROMETHAMINE 30 MG/ML IJ SOLN
30.0000 mg | Freq: Once | INTRAMUSCULAR | Status: AC
  Administered 2013-06-29: 30 mg via INTRAVENOUS
  Filled 2013-06-29: qty 1

## 2013-06-29 MED ORDER — SODIUM CHLORIDE 0.9 % IV BOLUS (SEPSIS)
1000.0000 mL | Freq: Once | INTRAVENOUS | Status: AC
  Administered 2013-06-29: 1000 mL via INTRAVENOUS

## 2013-06-29 MED ORDER — ONDANSETRON HCL 4 MG PO TABS: 4.0000 mg | ORAL_TABLET | Freq: Three times a day (TID) | ORAL | Status: DC | PRN

## 2013-06-29 MED ORDER — DEXAMETHASONE SODIUM PHOSPHATE 10 MG/ML IJ SOLN
10.0000 mg | Freq: Once | INTRAMUSCULAR | Status: AC
  Administered 2013-06-29: 10 mg via INTRAVENOUS
  Filled 2013-06-29: qty 1

## 2013-06-29 MED ORDER — ONDANSETRON HCL 4 MG/2ML IJ SOLN
4.0000 mg | Freq: Once | INTRAMUSCULAR | Status: AC
  Administered 2013-06-29: 4 mg via INTRAVENOUS
  Filled 2013-06-29: qty 2

## 2013-06-29 MED ORDER — ONDANSETRON 4 MG PO TBDP: 8.0000 mg | ORAL_TABLET | Freq: Once | ORAL | Status: DC

## 2013-06-29 MED ORDER — DIPHENHYDRAMINE HCL 50 MG/ML IJ SOLN
25.0000 mg | Freq: Once | INTRAMUSCULAR | Status: AC
  Administered 2013-06-29: 25 mg via INTRAVENOUS
  Filled 2013-06-29: qty 1

## 2013-06-29 NOTE — ED Provider Notes (Signed)
Please see my ED note.  Layla MawKristen N Ward, DO 06/29/13 1225

## 2013-06-29 NOTE — ED Provider Notes (Signed)
MSE was initiated and I personally evaluated the patient and placed orders (if any) at  11:42 AM on June 29, 2013.  The patient appears stable so that the remainder of the MSE may be completed by another provider.  Patient evaluated by myself in triage. She presents with multiple complaints. Patient complains of n/v which began today. Patient is actively vomiting in [facility] room. She has sensation like she needs to have diarrhea, but has not had BM in 2 days. Patient also with arm and leg itching and pain. Patient with headache. Neuro exam non focal. This headache is normal and nonconcerning for patient. Given that patient is actively vomiting, it is appropriate for patient to have further workup on acute side. Patient is hemodynamically stable. Zofran ordered for patient.   Mora BellmanHannah S Merrell, PA-C 06/29/13 1147

## 2013-06-29 NOTE — ED Notes (Signed)
Per PA, patient is actively vomiting.   Swollen arm moved into leg and now having N/V/D.   Patient will be moved.

## 2013-06-29 NOTE — ED Notes (Signed)
She states "it all started yesterday when my arms and feet were itching. Then today i woke up with nausea and a headache."

## 2013-06-29 NOTE — ED Notes (Signed)
Patient asked for urine sample. Instructed to press call bell when able to urinate.

## 2013-06-29 NOTE — ED Provider Notes (Signed)
TIME SEEN: 12:17 PM  CHIEF COMPLAINT: Multiple complaints  HPI: Patient is a 33 y.o. F with history of chronic headaches who presents emergency department with multiple complaints. Patient states for the past 2-3 days she has had intermittent erythematous pruritic rash but will appear on her extremities. She denies any new soaps, lotions, detergents, foods, medications. She states that it will spontaneously resolve but then showed some rales. She states she's also had nausea, vomiting and diarrhea today. She's had some crampy abdominal pain. She's also had a diffuse, throbbing headache that is similar to her chronic headaches that started today as well. She feels like her palms and muscles of her feet have been itching today. Denies any sick contacts or recent travel. No fevers. No neck pain or neck stiffness. No numbness or focal weakness. No head injury. No cough, sore throat, ear pain, dysuria or hematuria. No tick bite.  ROS: See HPI Constitutional: no fever  Eyes: no drainage  ENT: no runny nose   Cardiovascular:  no chest pain  Resp: no SOB  GI: no vomiting GU: no dysuria Integumentary: rash  Allergy: no hives  Musculoskeletal: no leg swelling  Neurological: no slurred speech ROS otherwise negative  PAST MEDICAL HISTORY/PAST SURGICAL HISTORY:  Past Medical History  Diagnosis Date  . Headache     MEDICATIONS:  Prior to Admission medications   Not on File    ALLERGIES:  No Known Allergies  SOCIAL HISTORY:  History  Substance Use Topics  . Smoking status: Current Every Day Smoker    Types: Cigarettes  . Smokeless tobacco: Not on file  . Alcohol Use: Yes    FAMILY HISTORY: History reviewed. No pertinent family history.  EXAM: BP 135/71  Pulse 72  Temp(Src) 98.3 F (36.8 C) (Oral)  Resp 16  Ht 5' 6" (1.676 m)  Wt 199 lb 3.2 oz (90.357 kg)  BMI 32.17 kg/m2  SpO2 98% CONSTITUTIONAL: Alert and oriented and responds appropriately to questions. Well-appearing;  well-nourished, appears uncomfortable but is nontoxic and in no distress HEAD: Normocephalic EYES: Conjunctivae clear, PERRL ENT: normal nose; no rhinorrhea; moist mucous membranes; pharynx without lesions noted, no tonsillar hypertrophy or exudate NECK: Supple, no meningismus, no LAD  CARD: RRR; S1 and S2 appreciated; no murmurs, no clicks, no rubs, no gallops RESP: Normal chest excursion without splinting or tachypnea; breath sounds clear and equal bilaterally; no wheezes, no rhonchi, no rales, no hypoxia or respiratory distress ABD/GI: Normal bowel sounds; non-distended; soft, non-tender, no rebound, no guarding, no peritoneal signs BACK:  The back appears normal and is non-tender to palpation, there is no CVA tenderness EXT: No joint effusion, Normal ROM in all joints; non-tender to palpation; no edema; normal capillary refill; no cyanosis    SKIN: Normal color for age and race; warm; patches of erythema with excoriation to her right arm near the lateral elbow with no purulent drainage or induration or warmth, no lesions in her mouth, rash on her palms or soles NEURO: Moves all extremities equally, sensation to light touch intact diffusely, cranial nerves II through XII intact, normal gait PSYCH: The patient's mood and manner are appropriate. Grooming and personal hygiene are appropriate.  MEDICAL DECISION MAKING: Patient here with multiple vague symptoms including rash, nausea, vomiting, diarrhea and chronic headache. She is well-appearing with a relatively benign exam. Possible viral illness. Will obtain abdominal labs, urine. Will give Toradol, Zofran, Benadryl, Decadron and reassess. Anticipate if workup is unremarkable, patient was able to be discharged home. She does  not have a PCP.  ED PROGRESS: Patient reports that her headache, itching, nausea or completely gone. She has been able to ambulate to the restroom without difficulty. Tolerating by mouth. Her labs are unremarkable including  negative ESR.  Urine shows no infection and pregnancy test is negative.  I feel patient is safe to be discharged home. We'll have her continue Benadryl as needed for rash and itching. We'll discharge with prescription for naproxen and Zofran. Discussed return precautions and supportive care instructions. Patient verbalizes understanding and is comfortable plan.     [place], DO 06/29/13 [phone]

## 2013-06-29 NOTE — Discharge Instructions (Signed)
Rash A rash is a change in the color or texture of your skin. There are many different types of rashes. You may have other problems that accompany your rash. CAUSES   Infections.  Allergic reactions. This can include allergies to pets or foods.  Certain medicines.  Exposure to certain chemicals, soaps, or cosmetics.  Heat.  Exposure to poisonous plants.  Tumors, both cancerous and noncancerous. SYMPTOMS   Redness.  Scaly skin.  Itchy skin.  Dry or cracked skin.  Bumps.  Blisters.  Pain. DIAGNOSIS  Your caregiver may do a physical exam to determine what type of rash you have. A skin sample (biopsy) may be taken and examined under a microscope. TREATMENT  Treatment depends on the type of rash you have. Your caregiver may prescribe certain medicines. For serious conditions, you may need to see a skin doctor (dermatologist). HOME CARE INSTRUCTIONS   Avoid the substance that caused your rash.  Do not scratch your rash. This can cause infection.  You may take cool baths to help stop itching.  Only take over-the-counter or prescription medicines as directed by your caregiver.  Keep all follow-up appointments as directed by your caregiver. SEEK IMMEDIATE MEDICAL CARE IF:  You have increasing pain, swelling, or redness.  You have a fever.  You have new or severe symptoms.  You have body aches, diarrhea, or vomiting.  Your rash is not better after 3 days. MAKE SURE YOU:  Understand these instructions.  Will watch your condition.  Will get help right away if you are not doing well or get worse. Document Released: 12/17/2001 Document Revised: 03/21/2011 Document Reviewed: 10/11/2010 [facility] Patient Information 2015 [place], [place]. This information is not intended to replace advice given to you by your health care provider. Make sure you discuss any questions you have with your health care provider. Migraine Headache A migraine headache is an intense,  throbbing pain on one or both sides of your head. A migraine can last for 30 minutes to several hours. CAUSES  The exact cause of a migraine headache is not always known. However, a migraine may be caused when nerves in the brain become irritated and release chemicals that cause inflammation. This causes pain. Certain things may also trigger migraines, such as:  Alcohol.  Smoking.  Stress.  Menstruation.  Aged cheeses.  Foods or drinks that contain nitrates, glutamate, aspartame, or tyramine.  Lack of sleep.  Chocolate.  Caffeine.  Hunger.  Physical exertion.  Fatigue.  Medicines used to treat chest pain (nitroglycerine), birth control pills, estrogen, and some blood pressure medicines. SIGNS AND SYMPTOMS  Pain on one or both sides of your head.  Pulsating or throbbing pain.  Severe pain that prevents daily activities.  Pain that is aggravated by any physical activity.  Nausea, vomiting, or both.  Dizziness.  Pain with exposure to bright lights, loud noises, or activity.  General sensitivity to bright lights, loud noises, or smells. Before you get a migraine, you may get warning signs that a migraine is coming (aura). An aura may include:  Seeing flashing lights.  Seeing bright spots, halos, or zig-zag lines.  Having tunnel vision or blurred vision.  Having feelings of numbness or tingling.  Having trouble talking.  Having muscle weakness. DIAGNOSIS  A migraine headache is often diagnosed based on:  Symptoms.  Physical exam.  A CT scan or MRI of your head. These imaging tests cannot diagnose migraines, but they can help rule out other causes of headaches. TREATMENT Medicines may  be given for pain and nausea. Medicines can also be given to help prevent recurrent migraines.  HOME CARE INSTRUCTIONS  Only take over-the-counter or prescription medicines for pain or discomfort as directed by your health care provider. The use of long-term narcotics is  not recommended.  Lie down in a dark, quiet room when you have a migraine.  Keep a journal to find out what may trigger your migraine headaches. For example, write down:  What you eat and drink.  How much sleep you get.  Any change to your diet or medicines.  Limit alcohol consumption.  Quit smoking if you smoke.  Get 7-9 hours of sleep, or as recommended by your health care provider.  Limit stress.  Keep lights dim if bright lights bother you and make your migraines worse. SEEK IMMEDIATE MEDICAL CARE IF:   Your migraine becomes severe.  You have a fever.  You have a stiff neck.  You have vision loss.  You have muscular weakness or loss of muscle control.  You start losing your balance or have trouble walking.  You feel faint or pass out.  You have severe symptoms that are different from your first symptoms. MAKE SURE YOU:   Understand these instructions.  Will watch your condition.  Will get help right away if you are not doing well or get worse. Document Released: 12/27/2004 Document Revised: 10/17/2012 Document Reviewed: 09/03/2012 [facility]ExitCare Patient Information 2015 [place], [place]. This information is not intended to replace advice given to you by your health care provider. Make sure you discuss any questions you have with your health care provider. Viral Gastroenteritis Viral gastroenteritis is also known as stomach flu. This condition affects the stomach and intestinal tract. It can cause sudden diarrhea and vomiting. The illness typically lasts 3 to 8 days. Most people develop an immune response that eventually gets rid of the virus. While this natural response develops, the virus can make you quite ill. CAUSES  Many different viruses can cause gastroenteritis, such as rotavirus or noroviruses. You can catch one of these viruses by consuming contaminated food or water. You may also catch a virus by sharing utensils or other personal items with an infected  person or by touching a contaminated surface. SYMPTOMS  The most common symptoms are diarrhea and vomiting. These problems can cause a severe loss of body fluids (dehydration) and a body salt (electrolyte) imbalance. Other symptoms may include:  Fever.  Headache.  Fatigue.  Abdominal pain. DIAGNOSIS  Your caregiver can usually diagnose viral gastroenteritis based on your symptoms and a physical exam. A stool sample may also be taken to test for the presence of viruses or other infections. TREATMENT  This illness typically goes away on its own. Treatments are aimed at rehydration. The most serious cases of viral gastroenteritis involve vomiting so severely that you are not able to keep fluids down. In these cases, fluids must be given through an intravenous line (IV). HOME CARE INSTRUCTIONS   Drink enough fluids to keep your urine clear or pale yellow. Drink small amounts of fluids frequently and increase the amounts as tolerated.  Ask your caregiver for specific rehydration instructions.  Avoid:  Foods high in sugar.  Alcohol.  Carbonated drinks.  Tobacco.  Juice.  Caffeine drinks.  Extremely hot or cold fluids.  Fatty, greasy foods.  Too much intake of anything at one time.  Dairy products until 24 to 48 hours after diarrhea stops.  You may consume probiotics. Probiotics are active cultures of  beneficial bacteria. They may lessen the amount and number of diarrheal stools in adults. Probiotics can be found in yogurt with active cultures and in supplements.  Wash your hands well to avoid spreading the virus.  Only take over-the-counter or prescription medicines for pain, discomfort, or fever as directed by your caregiver. Do not give aspirin to children. Antidiarrheal medicines are not recommended.  Ask your caregiver if you should continue to take your regular prescribed and over-the-counter medicines.  Keep all follow-up appointments as directed by your  caregiver. SEEK IMMEDIATE MEDICAL CARE IF:   You are unable to keep fluids down.  You do not urinate at least once every 6 to 8 hours.  You develop shortness of breath.  You notice blood in your stool or vomit. This may look like coffee grounds.  You have abdominal pain that increases or is concentrated in one small area (localized).  You have persistent vomiting or diarrhea.  You have a fever.  The patient is a child younger than 3 months, and he or she has a fever.  The patient is a child older than 3 months, and he or she has a fever and persistent symptoms.  The patient is a child older than 3 months, and he or she has a fever and symptoms suddenly get worse.  The patient is a baby, and he or she has no tears when crying. MAKE SURE YOU:   Understand these instructions.  Will watch your condition.  Will get help right away if you are not doing well or get worse. Document Released: 12/27/2004 Document Revised: 03/21/2011 Document Reviewed: 10/13/2010 [facility] Patient Information 2015 [place], [place]. This information is not intended to replace advice given to you by your health care provider. Make sure you discuss any questions you have with your health care provider.

## 2013-06-29 NOTE — ED Notes (Signed)
Received report from Amy, RN.  Pt with c/o headache and N/V.  Amy, RN reports pt is actively vomiting.

## 2013-09-11 ENCOUNTER — Encounter (HOSPITAL_COMMUNITY): Payer: Self-pay | Admitting: Emergency Medicine

## 2013-09-11 ENCOUNTER — Emergency Department (HOSPITAL_COMMUNITY)
Admission: EM | Admit: 2013-09-11 | Discharge: 2013-09-11 | Disposition: A | Discharge status: Home / Self Care | Payer: No Typology Code available for payment source | Attending: Emergency Medicine | Admitting: Emergency Medicine

## 2013-09-11 DIAGNOSIS — Y9289 Other specified places as the place of occurrence of the external cause: Secondary | ICD-10-CM | POA: Insufficient documentation

## 2013-09-11 DIAGNOSIS — S8990XA Unspecified injury of unspecified lower leg, initial encounter: Secondary | ICD-10-CM | POA: Insufficient documentation

## 2013-09-11 DIAGNOSIS — S838X9A Sprain of other specified parts of unspecified knee, initial encounter: Secondary | ICD-10-CM | POA: Diagnosis not present

## 2013-09-11 DIAGNOSIS — S99919A Unspecified injury of unspecified ankle, initial encounter: Secondary | ICD-10-CM | POA: Diagnosis present

## 2013-09-11 DIAGNOSIS — S86812A Strain of other muscle(s) and tendon(s) at lower leg level, left leg, initial encounter: Secondary | ICD-10-CM

## 2013-09-11 DIAGNOSIS — R296 Repeated falls: Secondary | ICD-10-CM | POA: Insufficient documentation

## 2013-09-11 DIAGNOSIS — F17200 Nicotine dependence, unspecified, uncomplicated: Secondary | ICD-10-CM | POA: Insufficient documentation

## 2013-09-11 DIAGNOSIS — S86819A Strain of other muscle(s) and tendon(s) at lower leg level, unspecified leg, initial encounter: Secondary | ICD-10-CM | POA: Diagnosis not present

## 2013-09-11 DIAGNOSIS — Y93E9 Activity, other interior property and clothing maintenance: Secondary | ICD-10-CM | POA: Diagnosis not present

## 2013-09-11 DIAGNOSIS — S99929A Unspecified injury of unspecified foot, initial encounter: Secondary | ICD-10-CM

## 2013-09-11 MED ORDER — IBUPROFEN 600 MG PO TABS: 600.0000 mg | ORAL_TABLET | Freq: Once | ORAL | Status: DC

## 2013-09-11 MED ORDER — IBUPROFEN 400 MG PO TABS
600.0000 mg | ORAL_TABLET | Freq: Once | ORAL | Status: AC
  Administered 2013-09-11: 600 mg via ORAL
  Filled 2013-09-11 (×2): qty 1

## 2013-09-11 NOTE — Discharge Instructions (Signed)
Cryotherapy °Cryotherapy means treatment with cold. Ice or gel packs can be used to reduce both pain and swelling. Ice is the most helpful within the first 24 to 48 hours after an injury or flare-up from overusing a muscle or joint. Sprains, strains, spasms, burning pain, shooting pain, and aches can all be eased with ice. Ice can also be used when recovering from surgery. Ice is effective, has very few side effects, and is safe for most people to use. °PRECAUTIONS  °Ice is not a safe treatment option for people with: °· Raynaud phenomenon. This is a condition affecting small blood vessels in the extremities. Exposure to cold may cause your problems to return. °· Cold hypersensitivity. There are many forms of cold hypersensitivity, including: °¨ Cold urticaria. Red, itchy hives appear on the skin when the tissues begin to warm after being iced. °¨ Cold erythema. This is a red, itchy rash caused by exposure to cold. °¨ Cold hemoglobinuria. Red blood cells break down when the tissues begin to warm after being iced. The hemoglobin that carry oxygen are passed into the urine because they cannot combine with blood proteins fast enough. °· Numbness or altered sensitivity in the area being iced. °If you have any of the following conditions, do not use ice until you have discussed cryotherapy with your caregiver: °· Heart conditions, such as arrhythmia, angina, or chronic heart disease. °· High blood pressure. °· Healing wounds or open skin in the area being iced. °· Current infections. °· Rheumatoid arthritis. °· Poor circulation. °· Diabetes. °Ice slows the blood flow in the region it is applied. This is beneficial when trying to stop inflamed tissues from spreading irritating chemicals to surrounding tissues. However, if you expose your skin to cold temperatures for too long or without the proper protection, you can damage your skin or nerves. Watch for signs of skin damage due to cold. °HOME CARE INSTRUCTIONS °Follow  these tips to use ice and cold packs safely. °· Place a dry or damp towel between the ice and skin. A damp towel will cool the skin more quickly, so you may need to shorten the time that the ice is used. °· For a more rapid response, add gentle compression to the ice. °· Ice for no more than 10 to 20 minutes at a time. The bonier the area you are icing, the less time it will take to get the benefits of ice. °· Check your skin after 5 minutes to make sure there are no signs of a poor response to cold or skin damage. °· Rest 20 minutes or more between uses. °· Once your skin is numb, you can end your treatment. You can test numbness by very lightly touching your skin. The touch should be so light that you do not see the skin dimple from the pressure of your fingertip. When using ice, most people will feel these normal sensations in this order: cold, burning, aching, and numbness. °· Do not use ice on someone who cannot communicate their responses to pain, such as small children or people with dementia. °HOW TO MAKE AN ICE PACK °Ice packs are the most common way to use ice therapy. Other methods include ice massage, ice baths, and cryosprays. Muscle creams that cause a cold, tingly feeling do not offer the same benefits that ice offers and should not be used as a substitute unless recommended by your caregiver. °To make an ice pack, do one of the following: °· Place crushed ice or a   bag of frozen vegetables in a sealable plastic bag. Squeeze out the excess air. Place this bag inside another plastic bag. Slide the bag into a pillowcase or place a damp towel between your skin and the bag.  Mix 3 parts water with 1 part rubbing alcohol. Freeze the mixture in a sealable plastic bag. When you remove the mixture from the freezer, it will be slushy. Squeeze out the excess air. Place this bag inside another plastic bag. Slide the bag into a pillowcase or place a damp towel between your skin and the bag. SEEK MEDICAL CARE  IF:  You develop white spots on your skin. This may give the skin a blotchy (mottled) appearance.  Your skin turns blue or pale.  Your skin becomes waxy or hard.  Your swelling gets worse. MAKE SURE YOU:   Understand these instructions.  Will watch your condition.  Will get help right away if you are not doing well or get worse. Document Released: 08/23/2010 Document Revised: 05/13/2013 Document Reviewed: 08/23/2010 [facility] Patient Information 2015 [place], [place]. This information is not intended to replace advice given to you by your health care provider. Make sure you discuss any questions you have with your health care provider. Wear the knee sleeve while up and around Take the Ibuprofen on a regular basis for the next 5 days than as needed  If not better in 7-10 days please see the orthopedist for further evaluation

## 2013-09-11 NOTE — ED Provider Notes (Signed)
CSN: [id number]     Arrival date & time 09/11/13  1939 History   First MD Initiated Contact with Patient 09/11/13 1956     Chief Complaint  Patient presents with  . Knee Pain     (Consider location/radiation/quality/duration/timing/severity/associated sxs/prior Treatment) HPI Comments: While attempting to stand from a kneeling position felt pain in the L knee that is very localized from superior aspect of patella to 2 inches above knee along the patellar tendon.  Patient is a 33 y.o. female presenting with knee pain. The history is provided by the patient.  Knee Pain Location:  Knee Injury: yes   Knee location:  L knee Pain details:    Quality:  Aching   Radiates to:  Does not radiate   Severity:  Mild   Onset quality:  Sudden   Timing:  Intermittent   Progression:  Unchanged Chronicity:  New Dislocation: no   Foreign body present:  No foreign bodies Prior injury to area:  No Relieved by:  Nothing Worsened by:  Activity Ineffective treatments:  None tried Associated symptoms: no fever, no numbness and no swelling     Past Medical History  Diagnosis Date  . Headache    Past Surgical History  Procedure Laterality Date  . Hand debridement     History reviewed. No pertinent family history. History  Substance Use Topics  . Smoking status: Current Every Day Smoker    Types: Cigarettes  . Smokeless tobacco: Not on file  . Alcohol Use: Yes   OB History   Grav Para Term Preterm Abortions TAB SAB Ect Mult Living   0 0 0 0  0 4     Review of Systems  Constitutional: Negative for fever and chills.  Musculoskeletal: Negative for joint swelling.  Skin: Negative for wound.  Neurological: Negative for weakness and numbness.  All other systems reviewed and are negative.     Allergies  Review of patient's allergies indicates no known allergies.  Home Medications   Prior to Admission medications   Medication Sig Start Date End Date Taking? Authorizing Provider   ibuprofen (ADVIL,MOTRIN) 600 MG tablet Take 1 tablet (600 mg total) by mouth once. 09/11/13   Arman Filter, NP   BP 123/89  Pulse 84  Temp(Src) 98.9 F (37.2 C) (Oral)  Resp 16  SpO2 99% Physical Exam  Nursing note and vitals reviewed. Constitutional: She is oriented to person, place, and time. She appears well-developed and well-nourished.  HENT:  Head: Normocephalic.  Eyes: Pupils are equal, round, and reactive to light.  Neck: Normal range of motion.  Cardiovascular: Normal rate.   Pulmonary/Chest: Effort normal.  Musculoskeletal: Normal range of motion. She exhibits tenderness. She exhibits no edema.       Left knee: She exhibits normal range of motion, no swelling, no effusion, no ecchymosis, no deformity, no laceration, no erythema, normal alignment and normal patellar mobility. Tenderness found. Patellar tendon tenderness noted.       Legs: Neurological: She is alert and oriented to person, place, and time.  Skin: Skin is warm. No erythema.    ED Course  Procedures (including critical care time) Labs Review Labs Reviewed - No data to display  Imaging Review No results found.   EKG Interpretation None      MDM   Final diagnoses:  Patellar tendon strain, left, initial encounter   Will treat with Ibuprofen and knee sleeve if not better in 1 week refer to orthopedics  Arman Filter, NP 09/14/13 1955  Arman Filter, NP 09/14/13 1956

## 2013-09-11 NOTE — ED Notes (Signed)
Pt in c/o left knee pain since trying to get up from kneeling on her knees while cleaning, increased pain with movement

## 2013-09-11 NOTE — ED Notes (Signed)
PT ambulated with baseline gait; VSS; A&Ox3; no signs of distress; respirations even and unlabored; skin warm and dry; no questions upon discharge.  

## 2013-09-15 NOTE — ED Provider Notes (Signed)
Medical screening examination/treatment/procedure(s) were performed by non-physician practitioner and as supervising physician I was immediately available for consultation/collaboration.   EKG Interpretation None        Seva Chancy, DO 09/15/13 1625 

## 2013-11-11 ENCOUNTER — Encounter (HOSPITAL_COMMUNITY): Payer: Self-pay | Admitting: Emergency Medicine

## 2014-02-28 ENCOUNTER — Encounter (HOSPITAL_COMMUNITY): Payer: Self-pay | Admitting: Emergency Medicine

## 2014-02-28 ENCOUNTER — Emergency Department (HOSPITAL_COMMUNITY)
Admission: EM | Admit: 2014-02-28 | Discharge: 2014-02-28 | Disposition: A | Discharge status: Home / Self Care | Payer: No Typology Code available for payment source | Attending: Emergency Medicine | Admitting: Emergency Medicine

## 2014-02-28 DIAGNOSIS — T7840XA Allergy, unspecified, initial encounter: Secondary | ICD-10-CM | POA: Diagnosis present

## 2014-02-28 DIAGNOSIS — Y9389 Activity, other specified: Secondary | ICD-10-CM | POA: Insufficient documentation

## 2014-02-28 DIAGNOSIS — T783XXA Angioneurotic edema, initial encounter: Secondary | ICD-10-CM | POA: Diagnosis not present

## 2014-02-28 DIAGNOSIS — Z720 Tobacco use: Secondary | ICD-10-CM | POA: Diagnosis not present

## 2014-02-28 DIAGNOSIS — T7802XA Anaphylactic reaction due to shellfish (crustaceans), initial encounter: Secondary | ICD-10-CM | POA: Insufficient documentation

## 2014-02-28 DIAGNOSIS — X58XXXA Exposure to other specified factors, initial encounter: Secondary | ICD-10-CM | POA: Insufficient documentation

## 2014-02-28 DIAGNOSIS — Y998 Other external cause status: Secondary | ICD-10-CM | POA: Diagnosis not present

## 2014-02-28 DIAGNOSIS — Y9289 Other specified places as the place of occurrence of the external cause: Secondary | ICD-10-CM | POA: Insufficient documentation

## 2014-02-28 DIAGNOSIS — R0602 Shortness of breath: Secondary | ICD-10-CM | POA: Diagnosis not present

## 2014-02-28 MED ORDER — METHYLPREDNISOLONE SODIUM SUCC 125 MG IJ SOLR
125.0000 mg | Freq: Once | INTRAMUSCULAR | Status: AC
  Administered 2014-02-28: 125 mg via INTRAVENOUS
  Filled 2014-02-28: qty 2

## 2014-02-28 MED ORDER — NOREPINEPHRINE BITARTRATE 1 MG/ML IV SOLN: 0.0000 ug/min | Freq: Once | INTRAVENOUS | Status: DC

## 2014-02-28 MED ORDER — EPINEPHRINE 0.3 MG/0.3ML IJ SOAJ
0.3000 mg | Freq: Once | INTRAMUSCULAR | Status: AC
  Administered 2014-02-28: 0.3 mg via INTRAMUSCULAR

## 2014-02-28 MED ORDER — DIPHENHYDRAMINE HCL 50 MG/ML IJ SOLN
25.0000 mg | Freq: Once | INTRAMUSCULAR | Status: AC
  Administered 2014-02-28: 25 mg via INTRAVENOUS
  Filled 2014-02-28: qty 1

## 2014-02-28 MED ORDER — EPINEPHRINE 0.3 MG/0.3ML IJ SOAJ: 0.3000 mg | Freq: Once | INTRAMUSCULAR | Status: AC

## 2014-02-28 MED ORDER — FAMOTIDINE IN NACL 20-0.9 MG/50ML-% IV SOLN
20.0000 mg | Freq: Once | INTRAVENOUS | Status: AC
  Administered 2014-02-28: 20 mg via INTRAVENOUS
  Filled 2014-02-28: qty 50

## 2014-02-28 MED ORDER — PREDNISONE 20 MG PO TABS: 60.0000 mg | ORAL_TABLET | Freq: Every day | ORAL | Status: DC

## 2014-02-28 MED ORDER — EPINEPHRINE 0.3 MG/0.3ML IJ SOAJ
INTRAMUSCULAR | Status: AC
  Filled 2014-02-28: qty 0.3

## 2014-02-28 MED ORDER — DIPHENHYDRAMINE HCL 25 MG PO TABS: 25.0000 mg | ORAL_TABLET | Freq: Four times a day (QID) | ORAL | Status: DC

## 2014-02-28 MED ORDER — FAMOTIDINE 20 MG PO TABS: 20.0000 mg | ORAL_TABLET | Freq: Two times a day (BID) | ORAL | Status: DC

## 2014-02-28 NOTE — ED Notes (Signed)
Pt. woke up this morning with generalized itching / hives and angioedema at lips and tongue , mild SOB , airway intact .

## 2014-02-28 NOTE — ED Notes (Signed)
Orders placed on wrong pt. Orders discontinued.

## 2014-02-28 NOTE — ED Provider Notes (Signed)
CSN: [id number]     Arrival date & time 02/28/14  0411 History  This chart was scribed for Olivia Mackielga M Dayn Barich, MD by Annye AsaAnna Dorsett, ED Scribe. This patient was seen in room B14C/B14C and the patient's care was started at 4:20 AM.    Chief Complaint  Patient presents with  . Angioedema  . Allergic Reaction   Patient is a 34 y.o. female presenting with allergic reaction. The history is provided by the patient. No language interpreter was used.  Allergic Reaction Presenting symptoms: difficulty swallowing      HPI Comments: Theresa Banks is a 34 y.o. female who presents to the Emergency Department complaining of generalized itching/hives with angioedema at lips and tongue with mild SOB. Patient reports that she ate shrimp tonight and suddenly felt itchy, flushed, swelling, and SOB; she has never had a reaction to shrimp before after eating it many times.   Past Medical History  Diagnosis Date  . Headache    Past Surgical History  Procedure Laterality Date  . Hand debridement     No family history on file. History  Substance Use Topics  . Smoking status: Current Every Day Smoker    Types: Cigarettes  . Smokeless tobacco: Not on file  . Alcohol Use: Yes   OB History    Gravida Para Term Preterm AB TAB SAB Ectopic Multiple Living   4 4 4  0 0 0 0  0 4     Review of Systems  HENT: Positive for facial swelling and trouble swallowing.   Respiratory: Positive for shortness of breath.   All other systems reviewed and are negative.  Allergies  Review of patient's allergies indicates no known allergies.  Home Medications   Prior to Admission medications   Medication Sig Start Date End Date Taking? Authorizing Provider  ibuprofen (ADVIL,MOTRIN) 600 MG tablet Take 1 tablet (600 mg total) by mouth once. 09/11/13   Arman FilterGail K Schulz, NP   BP 125/75 mmHg  Pulse 108  Temp(Src) 97.8 F (36.6 C) (Oral)  Resp 18  Ht 5\' 6"  (1.676 m)  Wt 199 lb (90.266 kg)  BMI 32.13 kg/m2  SpO2 99%  LMP  02/27/2014 Physical Exam  Constitutional: She is oriented to person, place, and time. She appears well-developed and well-nourished. No distress.  HENT:  Head: Normocephalic and atraumatic.  Mouth/Throat: Oropharynx is clear and moist. No oropharyngeal exudate.  Swollen tongue, lips, eyelids; handling own secretions  Eyes: EOM are normal. Pupils are equal, round, and reactive to light.  Neck: Normal range of motion. Neck supple.  Cardiovascular: Normal rate, regular rhythm and normal heart sounds.  Exam reveals no gallop and no friction rub.   No murmur heard. Pulmonary/Chest: Effort normal. No respiratory distress. She has no wheezes. She has no rales.  Abdominal: Soft. There is no tenderness. There is no rebound and no guarding.  Musculoskeletal: Normal range of motion. She exhibits no edema.  Neurological: She is alert and oriented to person, place, and time.  Skin: Skin is warm and dry. No rash noted.  Psychiatric: She has a normal mood and affect. Her behavior is normal.  Nursing note and vitals reviewed.   ED Course  Procedures   DIAGNOSTIC STUDIES: Oxygen Saturation is 99% on RA, normal by my interpretation.    COORDINATION OF CARE: 4:28 AM Discussed treatment plan with pt at bedside and pt agreed to plan.   Labs Review Labs Reviewed - No data to display  Imaging Review No results found.  EKG Interpretation None     CRITICAL CARE Performed by: Olivia Mackie Total critical care time: 60 min Critical care time was exclusive of separately billable procedures and treating other patients. Critical care was necessary to treat or prevent imminent or life-threatening deterioration. Critical care was time spent personally by me on the following activities: development of treatment plan with patient and/or surrogate as well as nursing, discussions with consultants, evaluation of patient's response to treatment, examination of patient, obtaining history from patient or  surrogate, ordering and performing treatments and interventions, ordering and review of laboratory studies, ordering and review of radiographic studies, pulse oximetry and re-evaluation of patient's condition. 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 The recorded information has been reviewed and is accurate.  34 year old female with acute onset of allergic reaction, angioedema of face, lips, tongue and posterior pharynx.  Patient was managing her own secretions and airway.  Upon presentation, but had significant threat.  After epinephrine, patient significantly improved.  She also received Benadryl, Pepcid and Solu-Medrol.  Patient has been closely monitored throughout her stay.  She will need a total of 4 hours post epinephrine.  Patient instructed she will need a follow-up with an allergist.  She will need to carry an EpiPen with her at all times.  No further shellfish ingestion.     Olivia Mackie, MD 02/28/14 [phone]

## 2014-02-28 NOTE — ED Provider Notes (Signed)
  Physical Exam  BP 135/69 mmHg  Pulse 79  Temp(Src) 97.8 F (36.6 C) (Oral)  Resp 18  Ht 5\' 6"  (1.676 m)  Wt 199 lb (90.266 kg)  BMI 32.13 kg/m2  SpO2 100%  LMP 02/27/2014  Physical Exam  ED Course  Procedures  MDM Patient reevaluated. Feels better. Symptoms have improved from prior stable. Will discharge home.      Juliet RudeNathan R. Rubin PayorPickering, MD 02/28/14 [phone]

## 2014-02-28 NOTE — Discharge Instructions (Signed)
Take medications as prescribed.  Keep an EpiPen with you at all times.  Do not eat any shellfish.  Follow-up with allergist for further evaluation   Anaphylactic Reaction An anaphylactic reaction is a sudden, severe allergic reaction. It affects the whole body. It can be life threatening. You may need to stay in the hospital.  [place] a medical bracelet or necklace that lists your allergy.  Carry your allergy kit or medicine shot to treat severe allergic reactions with you. These can save your life.  Do not drive until medicine from your shot has worn off, unless your doctor says it is okay.  If you have hives or a rash:  Take medicine as told by your doctor.  You may take over-the-counter antihistamine medicine.  Place cold cloths on your skin. Take baths in cool water. Avoid hot baths and hot showers. GET HELP RIGHT AWAY IF:   Your mouth is puffy (swollen), or you have trouble breathing.  You start making whistling sounds when you breathe (wheezing).  You have a tight feeling in your chest or throat.  You have a rash, hives, puffiness, or itching on your body.  You throw up (vomit) or have watery poop (diarrhea).  You feel dizzy or pass out (faint).  You think you are having an allergic reaction.  You have new symptoms. This is an emergency. Use your medicine shot or allergy kit as told. Call your local emergency services (911 in [place] Even if you feel better after the shot, you need to go to the hospital emergency department. MAKE SURE YOU:   Understand these instructions.  Will watch your condition.  Will get help right away if you are not doing well or get worse. Document Released: 06/15/2007 Document Revised: 06/28/2011 Document Reviewed: 03/30/2011 [facility] Patient Information 2015 [place], [place]. This information is not intended to replace advice given to you by your health care provider. Make sure you discuss any questions you have with your health  care provider.  Angioedema Angioedema is a sudden swelling of tissues, often of the skin. It can occur on the face or genitals or in the abdomen or other body parts. The swelling usually develops over a short period and gets better in 24 to 48 hours. It often begins during the night and is found when the person wakes up. The person may also get red, itchy patches of skin (hives). Angioedema can be dangerous if it involves swelling of the air passages.  Depending on the cause, episodes of angioedema may only happen once, come back in unpredictable patterns, or repeat for several years and then gradually fade away.  CAUSES  Angioedema can be caused by an allergic reaction to various triggers. It can also result from nonallergic causes, including reactions to drugs, immune system disorders, viral infections, or an abnormal gene that is passed to you from your parents (hereditary). For some people with angioedema, the cause is unknown.  Some things that can trigger angioedema include:   Foods.   Medicines, such as ACE inhibitors, ARBs, nonsteroidal anti-inflammatory agents, or estrogen.   Latex.   Animal saliva.   Insect stings.   Dyes used in X-rays.   Mild injury.   Dental work.  Surgery.  Stress.   Sudden changes in temperature.   Exercise. SIGNS AND SYMPTOMS   Swelling of the skin.  Hives. If these are present, there is also intense itching.  Redness in the affected area.   Pain in the affected area.  Swollen lips or tongue.  Breathing problems. This may happen if the air passages swell.  Wheezing. If internal organs are involved, there may be:   Nausea.   Abdominal pain.   Vomiting.   Difficulty swallowing.   Difficulty passing urine. DIAGNOSIS   Your health care provider will examine the affected area and take a medical and family history.  Various tests may be done to help determine the cause. Tests may include:  Allergy skin tests to  see if the problem is an allergic reaction.   Blood tests to check for hereditary angioedema.   Tests to check for underlying diseases that could cause the condition.   A review of your medicines, including over-the-counter medicines, may be done. TREATMENT  Treatment will depend on the cause of the angioedema. Possible treatments include:   Removal of anything that triggered the condition (such as stopping certain medicines).   Medicines to treat symptoms or prevent attacks. Medicines given may include:   Antihistamines.   Epinephrine injection.   Steroids.   Hospitalization may be required for severe attacks. If the air passages are affected, it can be an emergency. Tubes may need to be placed to keep the airway open. HOME CARE INSTRUCTIONS   Take all medicines as directed by your health care provider.  If you were given medicines for emergency allergy treatment, always carry them with you.  Wear a medical bracelet as directed by your health care provider.   Avoid known triggers. SEEK MEDICAL CARE IF:   You have repeat attacks of angioedema.   Your attacks are more frequent or more severe despite preventive measures.   You have hereditary angioedema and are considering having children. It is important to discuss with your health care provider the risks of passing the condition on to your children. SEEK IMMEDIATE MEDICAL CARE IF:   You have severe swelling of the mouth, tongue, or lips.  You have difficulty breathing.   You have difficulty swallowing.   You faint. MAKE SURE YOU:  Understand these instructions.  Will watch your condition.  Will get help right away if you are not doing well or get worse. Document Released: 03/07/2001 Document Revised: 05/13/2013 Document Reviewed: 08/20/2012 [facility] Patient Information 2015 [place], [place]. This information is not intended to replace advice given to you by your health care provider. Make sure you  discuss any questions you have with your health care provider.  Allergies  Allergies may happen from anything your body is sensitive to. This may be food, medicines, pollens, chemicals, and many other things. Food allergies can be severe and deadly.  HOME CARE  If you do not know what causes a reaction, keep a diary. Write down the foods you ate and the symptoms that followed. Avoid foods that cause reactions.  If you have red raised spots (hives) or a rash:  Take medicine as told by your doctor.  Use medicines for red raised spots and itching as needed.  Apply cold cloths (compresses) to the skin. Take a cool bath. Avoid hot baths or showers.  If you are severely allergic:  It is often necessary to go to the hospital after you have treated your reaction.  Wear your medical alert jewelry.  You and your family must learn how to give a allergy shot or use an allergy kit (anaphylaxis kit).  Always carry your allergy kit or shot with you. Use this medicine as told by your doctor if a severe reaction is occurring. GET HELP RIGHT  AWAY IF:  You have trouble breathing or are making high-pitched whistling sounds (wheezing).  You have a tight feeling in your chest or throat.  You have a puffy (swollen) mouth.  You have red raised spots, puffiness (swelling), or itching all over your body.  You have had a severe reaction that was helped by your allergy kit or shot. The reaction can return once the medicine has worn off.  You think you are having a food allergy. Symptoms most often happen within 30 minutes of eating a food.  Your symptoms have not gone away within 2 days or are getting worse.  You have new symptoms.  You want to retest yourself with a food or drink you think causes an allergic reaction. Only do this under the care of a doctor. MAKE SURE YOU:   Understand these instructions.  Will watch your condition.  Will get help right away if you are not doing well or get  worse. Document Released: 04/23/2012 Document Reviewed: 04/23/2012 [facility] Patient Information 2015 [place]. This information is not intended to replace advice given to you by your health care provider. Make sure you discuss any questions you have with your health care provider.  Epinephrine Injection Epinephrine is a medicine given by injection to temporarily treat an emergency allergic reaction. It is also used to treat severe asthmatic attacks and other lung problems. The medicine helps to enlarge (dilate) the small breathing tubes of the lungs. A life-threatening, sudden allergic reaction that involves the whole body is called anaphylaxis. Because of potential side effects, epinephrine should only be used as directed by your caregiver. RISKS AND COMPLICATIONS Possible side effects of epinephrine injections include:  Chest pain.  Irregular or rapid heartbeat.  Shortness of breath.  Nausea.  Vomiting.  Abdominal pain or cramping.  Sweating.  Dizziness.  Weakness.  Headache.  Nervousness. Report all side effects to your caregiver. HOW TO GIVE AN EPINEPHRINE INJECTION Give the epinephrine injection immediately when symptoms of a severe reaction begin. Inject the medicine into the outer thigh or any available, large muscle. Your caregiver can teach you how to do this. You do not need to remove any clothing. After the injection, call your local emergency services (911 in [place] Even if you improve after the injection, you need to be examined at a hospital emergency department. Epinephrine works quickly, but it also wears off quickly. Delayed reactions can occur. A delayed reaction may be as serious and dangerous as the initial reaction. HOME CARE INSTRUCTIONS  Make sure you and your family know how to give an epinephrine injection.  Use epinephrine injections as directed by your caregiver. Do not use this medicine more often or in larger doses than prescribed.  Always  carry your epinephrine injection or anaphylaxis kit with you. This can be lifesaving if you have a severe reaction.  Store the medicine in a cool, dry place. If the medicine becomes discolored or cloudy, dispose of it properly and replace it with new medicine.  Check the expiration date on your medicine. It may be unsafe to use medicines past their expiration date.  Tell your caregiver about any other medicines you are taking. Some medicines can react badly with epinephrine.  Tell your caregiver about any medical conditions you have, such as diabetes, high blood pressure (hypertension), heart disease, irregular heartbeats, or if you are pregnant. SEEK IMMEDIATE MEDICAL CARE IF:  You have used an epinephrine injection. Call your local emergency services (911 in [place] Even if you  improve after the injection, you need to be examined at a hospital emergency department to make sure your allergic reaction is under control. You will also be monitored for adverse effects from the medicine.  You have chest pain.  You have irregular or fast heartbeats.  You have shortness of breath.  You have severe headaches.  You have severe nausea, vomiting, or abdominal cramps.  You have severe pain, swelling, or redness in the area where you gave the injection. Document Released: 12/25/1999 Document Revised: 03/21/2011 Document Reviewed: 09/15/2010 [facility] Patient Information 2015 [place], [place]. This information is not intended to replace advice given to you by your health care provider. Make sure you discuss any questions you have with your health care provider.  Food Allergy and Anaphylaxis A food allergy occurs when the body reacts to proteins found in food. In the most common type of food allergy, the immune system creates chemicals usually made to fight germs (antibodies). These chemicals cause problems when the protein is eaten. Problems can also happen when the food is touched or bits of food get  into the air (such as while cooking) near someone who is allergic. The problems caused are the allergic symptoms. This type of food allergy must be taken seriously. Even very small amounts of a food can cause a reaction. Severe reactions can be sudden and potentially fatal. This type of food allergy can vary from mild to life threatening (anaphylaxis). It is impossible to predict what the next reaction will be like based on previous reactions.  There can be other problems with food that are not actually allergies. This document only reviews food allergies. CAUSES  It is not known why some people develop food allergy. It may be more common in families with other allergic problems. Any food can cause allergies but the most common ones are:  Peanuts.  Tree nuts (such as almonds, walnuts, hazelnuts, and pecans).  Fish (such as bass, flounder, and cod).  Shellfish (such as crab, lobster, and shrimp).  Milk.  Eggs.  Wheat.  Soybeans. SYMPTOMS  The symptoms of food allergy generally start within minutes to 2 hours after contact with the allergen. The symptoms can remain the same for several hours or get worse. Sometimes the reaction seems to improve only to return (often worse) later. Common signs/symptoms of a reaction include any or all of the following:  Skin: hives, itching, swelling, flushing.  Eyes: red, watery, itchy, swollen.  Nose: congested, runny, sneezing.  Mouth/throat: swelling of lips, tongue and throat. Itchy throat, hoarseness, choking sensation.  Lungs: Cough, difficulty breathing, wheezing (whistling noise when breathing out).  Gastrointestinal tract: Nausea (feeling sick to one's stomach), vomiting, diarrhea, cramps.  Heart and circulation: dizziness, weakness, fainting, turning pale, fast, slow or irregular pulse.  Nervous system: confusion, fear, sense of doom. Anaphylaxis is the most serious food allergy reaction. It can rapidly cause throat and tongue swelling,  difficulty breathing, low blood pressure, collapse and cardiac arrest (heart stops breathing). DIAGNOSIS  The diagnosis of food allergy is made in part on the history of prior reactions. To prove the diagnosis and to find what foods are responsible, your caregiver may suggest:  Allergy skin tests - usually done by allergists in a setting where emergency treatment is available.  Blood tests for allergy.  Food challenges - suspected food is given and the person is observed in a setting where emergency treatment is available.  Food diary - recording foods eaten and reactions.  Elimination diet - suspected  food(s) are removed from the diet. TREATMENT  Your caregiver may prescribe medicines to treat an allergic reaction such as:  Epinephrine (also called adrenaline), which comes in a self-injecting device. This is the most important medicine for treating serious food allergies.  Asthma medicine - usually inhaled - to treat breathing problems - in addition to epinephrine.  Antihistamines - to treat swelling and itching - in addition to epinephrine.  Steroids - given to calm down a serious reaction. Children can outgrow certain food allergies (like milk and eggs). Peanut and tree nut allergies are less likely to be outgrown. Because of this, sometimes repeat allergy testing is done. HOME CARE INSTRUCTIONS  Avoid contact with the offending food. Strict avoidance is the only way to prevent a reaction.   Keep the food out of the house.  Learn how to read food labels in order to spot the presence of any food you are allergic to. If a packaged food product does not contain an ingredient label, avoid the food product.  If you must have the offending food in the house, discuss how to avoid it with your caregiver. Be especially careful when eating in a restaurant.  Ask the cook or manager (not the waiter) if foods you are allergic to are found in any items on the menu.  Avoid:  [employer] foods  since oil can keep proteins from previously cooked foods.  Ice cream parlors, Asian restaurants and bakeries - these often use peanut or tree nut ingredients.  Buffets, picnics, school lunches and salad bars because of the risk of contact with foods you are allergic to.  Food prepared in a blender or shared grill.  Request that food be prepared with clean utensils or pans to avoid contamination from prior foods.  Let the staff know you have allergies that can cause serious reactions or death.  If you have a reaction, administer treatment and tell the staff to immediately call for emergency services ( 911 in the [place] If planning travel by air, inform the airline ahead of time (when making the reservation) that you have a food allergy. Wear a medical identification bracelet or necklace (such as one offered by [employer] ) noting your allergy.  If an epinephrine self-injector device is prescribed:  Carry your device everywhere at all times.  Learn how to use the device. Practice by injecting an expired injector into an orange.  Teach all family members, teachers, coaches, etc to use the injector.  Make an injector available at school, work, Social research officer, government.  Keep a spare injector in your car.  Educate others about your allergy. This includes school teachers and other school personnel. Tell them what you need to avoid, the symptoms of an allergic reaction, and how others can help during an allergic emergency.  If you experience a subsequent allergic reaction, administer epinephrine promptly and immediately call for emergency services (911 in the [place] SEEK MEDICAL CARE IF:   You have questions about food allergy or its treatments.  Allergy reactions are getting worse or more frequent.  You suspect new food allergies. SEEK IMMEDIATE MEDICAL CARE IF:   You or your child has a serious allergic reaction, even if you have given a shot of epinephrine.  Symptoms return after taking prescribed  treatments. MAKE SURE YOU:   Understand these instructions.  Will watch your condition.  Will get help right away if you are not doing well or get worse. Document Released: 10/06/2007 Document Revised: 03/21/2011 Document Reviewed: 11/14/2007 [employer] Patient Information  2015 [employer]. This information is not intended to replace advice given to you by your health care provider. Make sure you discuss any questions you have with your health care provider.  Seafood Allergy  Seafood allergies are usually a life-long problem. People are usually only allergic to one seafood group. Seafood allergy does not increase the risk of iodine allergy. Some conditions (scombroid fish poisoning and [place] allergy) may seem like allergic reactions to seafood, but are separate conditions. Bad reactions may also occur after eating seafood infected or tainted by algae-derived neurotoxins (ciguatera and paralytic shellfish poisoning). SYMPTOMS  Many allergic reactions to food are mild. Mild symptoms may be limited to hives or swelling in one area. The most dangerous symptoms are:  Breathing difficulties. This may occur from breathing in seafood allergen fumes when food is being cooked or in seafood processing factories.  A drop in blood pressure (shock).  Anaphylaxis is a severe whole body reaction. This is the most severe form of allergic reaction. Other symptoms include:   Swelling of the face or throat.  Dizziness.  Difficulty thinking.  Intense sense of fear.  Tightness in the chest.  Vomiting.  Diarrhea. TYPES OF SEAFOOD There are many types of seafood. The major groups of sea life that trigger allergic reactions are:  VERTEBRATES  Scaly fish (salmon, cod, mackerel, sardines, herring, anchovies, tuna, trout, haddock, [place]).  INVERTEBRATES  Crustaceans (prawns/shrimps, lobster, crab, crayfish, yabbies).  Mollusks.  Shellfish (clams, mussels, oysters,  scallops).  Cephalopods (octopus, cuttlefish, squid, calamari).  Gastropods (sea slugs, garden slugs, snails). As a rule, patients allergic to one group of seafood can usually tolerate those from another. Seafood allergy is most common in communities where seafood is an important part of the diet, such as [place] and [place]. Sensitivity is more common in adults than children.  Occasionally, intense cooking will partially or completely destroy the triggering allergen. This may explain why some patients allergic to fresh fish are able to tolerate salmon or tuna in a can. AVOIDING THE ALLERGEN IS AN IMPORTANT PART OF MANAGEMENT. Complete avoidance of one or more groups of seafood is often advised. It may be difficult to achieve in practice. Accidental exposure is more likely to occur when eating away from home. This is most true when eating at seafood restaurants. OTHER POTENTIAL SOURCES OF ACCIDENTAL EXPOSURE AND CROSS-CONTAMINATION INCLUDE:  Seafood platters (best avoided).  Asian foods in which shellfish can be a common ingredient or contaminant (prawns in fried rice or soups).  Food may be rolled in the same batter or cooked in the same oil as seafood (take-out fish and chips).  Anchovies (fish) in Caesar salads and as an ingredient, or Worcestershire sauce.  Contaminated barbecues.  Fish extracts are also occasionally used to remove particulate matter from some beverages such as wine and beer. This process is called "fining." SEAFOOD ALLERGY AND IODINE ALLERGY ARE UNRELATED. Even though seafood is a rich source of natural iodine, allergic reactions to seafood proteins have a different mechanism to that of iodine. Iodine can be found in topical antiseptics and x-ray contrast agents. Patients allergic to seafood are not at an increased risk of allergic reactions to iodine. Those with iodine allergy are not at increased risk of seafood allergy. SEEK IMMEDIATE MEDICAL CARE IF:  You have  difficulty breathing, or you are wheezing or have a tight feeling in your chest or throat.  You have a swollen mouth, or have hives, swelling or itching over your body.  You  feel faint or pass out.  You develop chest pain or a worsening of the problems which originally caused you to seek medical help. If you have eaten seafood and develop problems or symptoms that seem unusual for you, seek advice from your caregiver. If the problems are severe, call your local emergency medical service. Document Released: 06/18/2001 Document Revised: 03/21/2011 Document Reviewed: 04/12/2013 [facility] Patient Information 2015 [place], [place]. This information is not intended to replace advice given to you by your health care provider. Make sure you discuss any questions you have with your health care provider.

## 2014-09-18 ENCOUNTER — Emergency Department (HOSPITAL_COMMUNITY)
Admission: EM | Admit: 2014-09-18 | Discharge: 2014-09-18 | Disposition: A | Discharge status: Home / Self Care | Payer: No Typology Code available for payment source | Attending: Emergency Medicine | Admitting: Emergency Medicine

## 2014-09-18 ENCOUNTER — Encounter (HOSPITAL_COMMUNITY): Payer: Self-pay | Admitting: Emergency Medicine

## 2014-09-18 ENCOUNTER — Emergency Department (HOSPITAL_COMMUNITY): Payer: No Typology Code available for payment source

## 2014-09-18 DIAGNOSIS — Z720 Tobacco use: Secondary | ICD-10-CM | POA: Diagnosis not present

## 2014-09-18 DIAGNOSIS — B373 Candidiasis of vulva and vagina: Secondary | ICD-10-CM | POA: Diagnosis not present

## 2014-09-18 DIAGNOSIS — Z3202 Encounter for pregnancy test, result negative: Secondary | ICD-10-CM | POA: Diagnosis not present

## 2014-09-18 DIAGNOSIS — B379 Candidiasis, unspecified: Secondary | ICD-10-CM

## 2014-09-18 DIAGNOSIS — R1032 Left lower quadrant pain: Secondary | ICD-10-CM | POA: Diagnosis present

## 2014-09-18 DIAGNOSIS — Z79899 Other long term (current) drug therapy: Secondary | ICD-10-CM | POA: Diagnosis not present

## 2014-09-18 DIAGNOSIS — R109 Unspecified abdominal pain: Secondary | ICD-10-CM

## 2014-09-18 LAB — COMPREHENSIVE METABOLIC PANEL
ALT: 10 U/L — ABNORMAL LOW (ref 14–54)
AST: 18 U/L (ref 15–41)
Albumin: 4.4 g/dL (ref 3.5–5.0)
Alkaline Phosphatase: 45 U/L (ref 38–126)
Anion gap: 7 (ref 5–15)
BUN: 5 mg/dL — ABNORMAL LOW (ref 6–20)
CO2: 23 mmol/L (ref 22–32)
Calcium: 9.6 mg/dL (ref 8.9–10.3)
Chloride: 109 mmol/L (ref 101–111)
Creatinine, Ser: 0.99 mg/dL (ref 0.44–1.00)
GFR calc Af Amer: >60 mL/min (ref >60)
GFR calc non Af Amer: >60 mL/min (ref >60)
Glucose, Bld: 92 mg/dL (ref 65–99)
Potassium: 3.9 mmol/L (ref 3.5–5.1)
Sodium: 139 mmol/L (ref 135–145)
Total Bilirubin: 0.5 mg/dL (ref 0.3–1.2)
Total Protein: 7.3 g/dL (ref 6.5–8.1)

## 2014-09-18 LAB — URINE MICROSCOPIC-ADD ON

## 2014-09-18 LAB — CBC
HEMATOCRIT: 38.5 % (ref 36.0–46.0)
Hemoglobin: 12.7 g/dL (ref 12.0–15.0)
MCH: 29.7 pg (ref 26.0–34.0)
MCHC: 33 g/dL (ref 30.0–36.0)
MCV: 90 fL (ref 78.0–100.0)
PLATELETS: 287 10*3/uL (ref 150–400)
RBC: 4.28 MIL/uL (ref 3.87–5.11)
RDW: 13 % (ref 11.5–15.5)
WBC: 6.1 10*3/uL (ref 4.0–10.5)

## 2014-09-18 LAB — URINALYSIS, ROUTINE W REFLEX MICROSCOPIC
BILIRUBIN URINE: NEGATIVE
Glucose, UA: NEGATIVE mg/dL
KETONES UR: 15 mg/dL — AB
Nitrite: NEGATIVE
PH: 5.5 (ref 5.0–8.0)
Protein, ur: NEGATIVE mg/dL
Specific Gravity, Urine: 1.026 (ref 1.005–1.030)
UROBILINOGEN UA: 1 mg/dL (ref 0.0–1.0)

## 2014-09-18 LAB — WET PREP, GENITAL
Clue Cells Wet Prep HPF POC: NONE SEEN
Trich, Wet Prep: NONE SEEN

## 2014-09-18 LAB — LIPASE, BLOOD: Lipase: 23 U/L (ref 22–51)

## 2014-09-18 LAB — POC URINE PREG, ED: PREG TEST UR: NEGATIVE

## 2014-09-18 MED ORDER — IBUPROFEN 600 MG PO TABS: 600.0000 mg | ORAL_TABLET | Freq: Once | ORAL | Status: DC

## 2014-09-18 MED ORDER — FLUCONAZOLE 100 MG PO TABS
150.0000 mg | ORAL_TABLET | Freq: Once | ORAL | Status: AC
  Administered 2014-09-18: 150 mg via ORAL
  Filled 2014-09-18: qty 2

## 2014-09-18 NOTE — Discharge Instructions (Signed)
We saw you in the ER for the abdominal pain and back pain. All the results in the ER are normal, labs and imaging. We are not sure what is causing your symptoms. The workup in the ER is not complete, and is limited to screening for life threatening and emergent conditions only, so please see a primary care doctor for further evaluation.  Please return to the ER if your symptoms worsen; you have increased pain, fevers, chills, inability to keep any medications down, confusion. Otherwise see the outpatient doctor as requested.   Abdominal Pain, Women Abdominal (stomach, pelvic, or belly) pain can be caused by many things. It is important to tell your doctor:  The location of the pain.  Does it come and go or is it present all the time?  Are there things that start the pain (eating certain foods, exercise)?  Are there other symptoms associated with the pain (fever, nausea, vomiting, diarrhea)? All of this is helpful to know when trying to find the cause of the pain. CAUSES   Stomach: virus or bacteria infection, or ulcer.  Intestine: appendicitis (inflamed appendix), regional ileitis (Crohn's disease), ulcerative colitis (inflamed colon), irritable bowel syndrome, diverticulitis (inflamed diverticulum of the colon), or cancer of the stomach or intestine.  Gallbladder disease or stones in the gallbladder.  Kidney disease, kidney stones, or infection.  Pancreas infection or cancer.  Fibromyalgia (pain disorder).  Diseases of the female organs:  Uterus: fibroid (non-cancerous) tumors or infection.  Fallopian tubes: infection or tubal pregnancy.  Ovary: cysts or tumors.  Pelvic adhesions (scar tissue).  Endometriosis (uterus lining tissue growing in the pelvis and on the pelvic organs).  Pelvic congestion syndrome (female organs filling up with blood just before the menstrual period).  Pain with the menstrual period.  Pain with ovulation (producing an egg).  Pain with an  IUD (intrauterine device, birth control) in the uterus.  Cancer of the female organs.  Functional pain (pain not caused by a disease, may improve without treatment).  Psychological pain.  Depression. DIAGNOSIS  Your doctor will decide the seriousness of your pain by doing an examination.  Blood tests.  X-rays.  Ultrasound.  CT scan (computed tomography, special type of X-ray).  MRI (magnetic resonance imaging).  Cultures, for infection.  Barium enema (dye inserted in the large intestine, to better view it with X-rays).  Colonoscopy (looking in intestine with a lighted tube).  Laparoscopy (minor surgery, looking in abdomen with a lighted tube).  Major abdominal exploratory surgery (looking in abdomen with a large incision). TREATMENT  The treatment will depend on the cause of the pain.   Many cases can be observed and treated at home.  Over-the-counter medicines recommended by your caregiver.  Prescription medicine.  Antibiotics, for infection.  Birth control pills, for painful periods or for ovulation pain.  Hormone treatment, for endometriosis.  Nerve blocking injections.  Physical therapy.  Antidepressants.  Counseling with a psychologist or psychiatrist.  Minor or major surgery. HOME CARE INSTRUCTIONS   Do not take laxatives, unless directed by your caregiver.  Take over-the-counter pain medicine only if ordered by your caregiver. Do not take aspirin because it can cause an upset stomach or bleeding.  Try a clear liquid diet (broth or water) as ordered by your caregiver. Slowly move to a bland diet, as tolerated, if the pain is related to the stomach or intestine.  Have a thermometer and take your temperature several times a day, and record it.  Bed rest and sleep,  if it helps the pain.  Avoid sexual intercourse, if it causes pain.  Avoid stressful situations.  Keep your follow-up appointments and tests, as your caregiver orders.  If the  pain does not go away with medicine or surgery, you may try:  Acupuncture.  Relaxation exercises (yoga, meditation).  Group therapy.  Counseling. SEEK MEDICAL CARE IF:   You notice certain foods cause stomach pain.  Your home care treatment is not helping your pain.  You need stronger pain medicine.  You want your IUD removed.  You feel faint or lightheaded.  You develop nausea and vomiting.  You develop a rash.  You are having side effects or an allergy to your medicine. SEEK IMMEDIATE MEDICAL CARE IF:   Your pain does not go away or gets worse.  You have a fever.  Your pain is felt only in portions of the abdomen. The right side could possibly be appendicitis. The left lower portion of the abdomen could be colitis or diverticulitis.  You are passing blood in your stools (bright red or black tarry stools, with or without vomiting).  You have blood in your urine.  You develop chills, with or without a fever.  You pass out. MAKE SURE YOU:   Understand these instructions.  Will watch your condition.  Will get help right away if you are not doing well or get worse. Document Released: 10/24/2006 Document Revised: 05/13/2013 Document Reviewed: 11/13/2008 [facility] Patient Information 2015 [place], [place]. This information is not intended to replace advice given to you by your health care provider. Make sure you discuss any questions you have with your health care provider.  Flank Pain Flank pain refers to pain that is located on the side of the body between the upper abdomen and the back. The pain may occur over a short period of time (acute) or may be long-term or reoccurring (chronic). It may be mild or severe. Flank pain can be caused by many things. CAUSES  Some of the more common causes of flank pain include:  Muscle strains.   Muscle spasms.   A disease of your spine (vertebral disk disease).   A lung infection (pneumonia).   Fluid around your lungs  (pulmonary edema).   A kidney infection.   Kidney stones.   A very painful skin rash caused by the chickenpox virus (shingles).   Gallbladder disease.  HOME CARE INSTRUCTIONS  Home care will depend on the cause of your pain. In general,  Rest as directed by your caregiver.  Drink enough fluids to keep your urine clear or pale yellow.  Only take over-the-counter or prescription medicines as directed by your caregiver. Some medicines may help relieve the pain.  Tell your caregiver about any changes in your pain.  Follow up with your caregiver as directed. SEEK IMMEDIATE MEDICAL CARE IF:   Your pain is not controlled with medicine.   You have new or worsening symptoms.  Your pain increases.   You have abdominal pain.   You have shortness of breath.   You have persistent nausea or vomiting.   You have swelling in your abdomen.   You feel faint or pass out.   You have blood in your urine.  You have a fever or persistent symptoms for more than 2-3 days.  You have a fever and your symptoms suddenly get worse. MAKE SURE YOU:   Understand these instructions.  Will watch your condition.  Will get help right away if you are not doing well  or get worse. Document Released: 02/17/2005 Document Revised: 09/21/2011 Document Reviewed: 08/11/2011 [facility] Patient Information 2015 [place], [place]. This information is not intended to replace advice given to you by your health care provider. Make sure you discuss any questions you have with your health care provider.

## 2014-09-18 NOTE — ED Provider Notes (Signed)
CSN: [id number]     Arrival date & time 09/18/14  1437 History   First MD Initiated Contact with Patient 09/18/14 1522     Chief Complaint  Patient presents with  . Abdominal Pain  . Flank Pain     (Consider location/radiation/quality/duration/timing/severity/associated sxs/prior Treatment) Patient is a 34 y.o. female presenting with abdominal pain and flank pain. The history is provided by the patient.  Abdominal Pain Pain location:  L flank and LLQ Pain quality: squeezing   Pain radiates to:  Does not radiate Onset quality:  Sudden Duration:  8 hours Timing:  Constant Progression:  Waxing and waning Chronicity:  New Context: not trauma   Associated symptoms: no chills, no constipation, no diarrhea, no fever, no hematuria, no melena, no nausea, no vaginal bleeding and no vaginal discharge   Risk factors: not pregnant   Flank Pain Associated symptoms include abdominal pain.    Past Medical History  Diagnosis Date  . Headache    Past Surgical History  Procedure Laterality Date  . Hand debridement     No family history on file. Social History  Substance Use Topics  . Smoking status: Current Every Day Smoker -- 0.50 packs/day    Types: Cigarettes  . Smokeless tobacco: None  . Alcohol Use: Yes     Comment: socially   OB History    Gravida Para Term Preterm AB TAB SAB Ectopic Multiple Living   4 4 4  0 0 0 0  0 4     Review of Systems  Constitutional: Negative for fever and chills.  Gastrointestinal: Positive for abdominal pain. Negative for nausea, diarrhea, constipation and melena.  Genitourinary: Positive for flank pain. Negative for hematuria, vaginal bleeding and vaginal discharge.  All other systems reviewed and are negative.     Allergies  Shellfish allergy  Home Medications   Prior to Admission medications   Medication Sig Start Date End Date Taking? Authorizing Provider  diphenhydrAMINE (BENADRYL) 25 MG tablet Take 1 tablet (25 mg total) by mouth  every 6 (six) hours. 02/28/14   Marisa Severin, MD  EPINEPHrine (EPIPEN 2-PAK) 0.3 mg/0.3 mL IJ SOAJ injection Inject 0.3 mLs (0.3 mg total) into the muscle once. Then go to nearest ER 02/28/14   Marisa Severin, MD  famotidine (PEPCID) 20 MG tablet Take 1 tablet (20 mg total) by mouth 2 (two) times daily. 02/28/14   Marisa Severin, MD  ibuprofen (ADVIL,MOTRIN) 600 MG tablet Take 1 tablet (600 mg total) by mouth once. 09/18/14   Cana Mignano Rhunette Croft, MD   BP 127/77 mmHg  Pulse 62  Temp(Src) 98.5 F (36.9 C)  Resp 18  SpO2 99%  LMP 09/11/2014 Physical Exam  Constitutional: She is oriented to person, place, and time. She appears well-developed and well-nourished.  HENT:  Head: Normocephalic and atraumatic.  Eyes: EOM are normal. Pupils are equal, round, and reactive to light.  Neck: Neck supple.  Cardiovascular: Normal rate, regular rhythm and normal heart sounds.   No murmur heard. Pulmonary/Chest: Effort normal. No respiratory distress.  Abdominal: Soft. She exhibits no distension. There is tenderness. There is no rebound and no guarding.  LLQ tenderness, mild, no rebound or guarding  Genitourinary:  L flank tenderness  Neurological: She is alert and oriented to person, place, and time.  Skin: Skin is warm and dry.  Nursing note and vitals reviewed.   ED Course  Procedures (including critical care time) Labs Review Labs Reviewed  WET PREP, GENITAL - Abnormal; Notable for the following:  Yeast Wet Prep HPF POC FEW (*)    WBC, Wet Prep HPF POC FEW (*)    All other components within normal limits  COMPREHENSIVE METABOLIC PANEL - Abnormal; Notable for the following:    BUN 5 (*)    ALT 10 (*)    All other components within normal limits  URINALYSIS, ROUTINE W REFLEX MICROSCOPIC (NOT AT [facility]) - Abnormal; Notable for the following:    Color, Urine AMBER (*)    Hgb urine dipstick LARGE (*)    Ketones, ur 15 (*)    Leukocytes, UA SMALL (*)    All other components within normal limits  URINE  MICROSCOPIC-ADD ON - Abnormal; Notable for the following:    Squamous Epithelial / LPF FEW (*)    Bacteria, UA FEW (*)    All other components within normal limits  URINE CULTURE  LIPASE, BLOOD  CBC  POC URINE PREG, ED  GC/CHLAMYDIA PROBE AMP ([place]) NOT AT [facility]    Imaging Review US Transvaginal Non-ob  09/18/2014   CLINICAL DATA:  Left pelvic pain left flank pain  EXAM: TRANSABDOMINAL AND TRANSVAGINAL ULTRASOUND OF PELVIS  TECHNIQUE: Both transabdominal and transvaginal ultrasound examinations of the pelvis were performed. Transabdominal technique was performed for global imaging of the pelvis including uterus, ovaries, adnexal regions, and pelvic cul-de-sac. It was necessary to proceed with endovaginal exam following the transabdominal exam to visualize the uterus and ovaries.  COMPARISON:  None  FINDINGS: Uterus  Measurements: 9.5 x 4.3 x 6.1 Cm. No fibroids or other mass visualized.  Endometrium  Thickness: 3 mm.  No focal abnormality visualized.  Right ovary  Measurements: 54 x 20 x 32 mm. Normal appearance/no adnexal mass. Numerous follicles measuring up to 2 cm.  Left ovary  Measurements: 31 x 20 x 31 mm. Normal appearance/no adnexal mass. Numerous follicles measuring up to 2 cm.  Other findings  Trace fluid in the cul-de-sac  IMPRESSION: Numerous ovarian follicles.  Trace free fluid.  Uterus appears prominent but without focal mass.   Electronically Signed   By: Esperanza Heir M.D.   On: 09/18/2014 19:49   US Pelvis Complete  09/18/2014   CLINICAL DATA:  Left pelvic pain left flank pain  EXAM: TRANSABDOMINAL AND TRANSVAGINAL ULTRASOUND OF PELVIS  TECHNIQUE: Both transabdominal and transvaginal ultrasound examinations of the pelvis were performed. Transabdominal technique was performed for global imaging of the pelvis including uterus, ovaries, adnexal regions, and pelvic cul-de-sac. It was necessary to proceed with endovaginal exam following the transabdominal exam to visualize the  uterus and ovaries.  COMPARISON:  None  FINDINGS: Uterus  Measurements: 9.5 x 4.3 x 6.1 Cm. No fibroids or other mass visualized.  Endometrium  Thickness: 3 mm.  No focal abnormality visualized.  Right ovary  Measurements: 54 x 20 x 32 mm. Normal appearance/no adnexal mass. Numerous follicles measuring up to 2 cm.  Left ovary  Measurements: 31 x 20 x 31 mm. Normal appearance/no adnexal mass. Numerous follicles measuring up to 2 cm.  Other findings  Trace fluid in the cul-de-sac  IMPRESSION: Numerous ovarian follicles.  Trace free fluid.  Uterus appears prominent but without focal mass.   Electronically Signed   By: Esperanza Heir M.D.   On: 09/18/2014 19:49   I have personally reviewed and evaluated these images and lab results as part of my medical decision-making.   EKG Interpretation None      MDM   Final diagnoses:  Acute left flank pain  Candida infection  Pt with L sided abdominal and flank tenderness. She has no vaginal discharge, bleeding. She is G4P4 in a stable marries relationship. She has no uti like sx, except ? freq urination, and no hx of renal stones or pelvic pathology. UA ordered - if it shows infection, will treat as a pyelonephritis. Otherwise, we will get a pelvic exam and possibly further imaging.   8:10 PM Pelvic exam is benign, just some L sided adnexal fullness. [place] ordered. Still dont think pt has diverticulitis, abscess, stones. Return precautions discussed.   Derwood Kaplan, MD 09/18/14 2012

## 2014-09-18 NOTE — ED Notes (Signed)
Patient states L lower abdominal and L flank pain that started this morning.  Patient denies any urinary symptoms.   Patient states no other symptoms.

## 2014-09-19 LAB — GC/CHLAMYDIA PROBE AMP (~~LOC~~) NOT AT ARMC
CHLAMYDIA, DNA PROBE: NEGATIVE
NEISSERIA GONORRHEA: NEGATIVE

## 2014-09-19 LAB — URINE CULTURE: SPECIAL REQUESTS: NORMAL

## 2015-09-14 ENCOUNTER — Encounter (HOSPITAL_COMMUNITY): Payer: Self-pay

## 2015-09-14 ENCOUNTER — Emergency Department (HOSPITAL_COMMUNITY)
Admission: EM | Admit: 2015-09-14 | Discharge: 2015-09-14 | Disposition: A | Discharge status: Home / Self Care | Payer: BLUE CROSS/BLUE SHIELD | Attending: Emergency Medicine | Admitting: Emergency Medicine

## 2015-09-14 DIAGNOSIS — Z79899 Other long term (current) drug therapy: Secondary | ICD-10-CM | POA: Insufficient documentation

## 2015-09-14 DIAGNOSIS — F17210 Nicotine dependence, cigarettes, uncomplicated: Secondary | ICD-10-CM | POA: Insufficient documentation

## 2015-09-14 DIAGNOSIS — R110 Nausea: Secondary | ICD-10-CM | POA: Insufficient documentation

## 2015-09-14 DIAGNOSIS — Z3490 Encounter for supervision of normal pregnancy, unspecified, unspecified trimester: Secondary | ICD-10-CM

## 2015-09-14 DIAGNOSIS — Z3202 Encounter for pregnancy test, result negative: Secondary | ICD-10-CM | POA: Diagnosis not present

## 2015-09-14 LAB — CBC WITH DIFFERENTIAL/PLATELET
BASOS ABS: 0 10*3/uL (ref 0.0–0.1)
BASOS PCT: 0 %
EOS PCT: 2 %
Eosinophils Absolute: 0.1 10*3/uL (ref 0.0–0.7)
HCT: 38.7 % (ref 36.0–46.0)
Hemoglobin: 12.6 g/dL (ref 12.0–15.0)
LYMPHS PCT: 30 %
Lymphs Abs: 2.4 10*3/uL (ref 0.7–4.0)
MCH: 28.8 pg (ref 26.0–34.0)
MCHC: 32.6 g/dL (ref 30.0–36.0)
MCV: 88.6 fL (ref 78.0–100.0)
Monocytes Absolute: 0.4 10*3/uL (ref 0.1–1.0)
Monocytes Relative: 5 %
Neutro Abs: 4.9 10*3/uL (ref 1.7–7.7)
Neutrophils Relative %: 63 %
PLATELETS: 253 10*3/uL (ref 150–400)
RBC: 4.37 MIL/uL (ref 3.87–5.11)
RDW: 12.8 % (ref 11.5–15.5)
WBC: 7.8 10*3/uL (ref 4.0–10.5)

## 2015-09-14 LAB — URINALYSIS, ROUTINE W REFLEX MICROSCOPIC
Bilirubin Urine: NEGATIVE
Glucose, UA: NEGATIVE mg/dL
Ketones, ur: NEGATIVE mg/dL
LEUKOCYTES UA: NEGATIVE
Nitrite: NEGATIVE
PROTEIN: NEGATIVE mg/dL
Specific Gravity, Urine: 1.029 (ref 1.005–1.030)
pH: 5.5 (ref 5.0–8.0)

## 2015-09-14 LAB — COMPREHENSIVE METABOLIC PANEL
ALBUMIN: 4.4 g/dL (ref 3.5–5.0)
ALT: 10 U/L — AB (ref 14–54)
AST: 19 U/L (ref 15–41)
Alkaline Phosphatase: 45 U/L (ref 38–126)
Anion gap: 4 — ABNORMAL LOW (ref 5–15)
BUN: 9 mg/dL (ref 6–20)
CHLORIDE: 107 mmol/L (ref 101–111)
CO2: 24 mmol/L (ref 22–32)
CREATININE: 0.88 mg/dL (ref 0.44–1.00)
Calcium: 9.6 mg/dL (ref 8.9–10.3)
GFR calc Af Amer: >60 mL/min (ref >60)
GFR calc non Af Amer: >60 mL/min (ref >60)
GLUCOSE: 92 mg/dL (ref 65–99)
Potassium: 3.8 mmol/L (ref 3.5–5.1)
SODIUM: 135 mmol/L (ref 135–145)
Total Bilirubin: 0.4 mg/dL (ref 0.3–1.2)
Total Protein: 7.3 g/dL (ref 6.5–8.1)

## 2015-09-14 LAB — URINE MICROSCOPIC-ADD ON

## 2015-09-14 LAB — POC URINE PREG, ED: PREG TEST UR: NEGATIVE

## 2015-09-14 LAB — I-STAT BETA HCG BLOOD, ED (MC, WL, AP ONLY): I-stat hCG, quantitative: 49.8 m[IU]/mL — ABNORMAL HIGH (ref <5)

## 2015-09-14 LAB — LIPASE, BLOOD: Lipase: 26 U/L (ref 11–51)

## 2015-09-14 MED ORDER — ONDANSETRON 4 MG PO TBDP
8.0000 mg | ORAL_TABLET | Freq: Once | ORAL | Status: AC
  Administered 2015-09-14: 8 mg via ORAL
  Filled 2015-09-14: qty 2

## 2015-09-14 MED ORDER — VITAMIN B-6 25 MG PO TABS: 25.0000 mg | ORAL_TABLET | Freq: Three times a day (TID) | ORAL | 0 refills | Status: DC

## 2015-09-14 NOTE — ED Triage Notes (Signed)
Pt reports nausea X2 days. Pt denies emesis. LMP 08/17/15.

## 2015-09-14 NOTE — ED Provider Notes (Signed)
[facility]-EMERGENCY DEPT Provider Note   CSN: [id number] Arrival date & time: 09/14/15  1825     History   Chief Complaint Chief Complaint  Patient presents with  . Nausea    HPI Theresa Banks is a 35 y.o. female.  Patient presents to the ED with a chief complaint of nausea.  She states that she has felt nauseated for the past 2 days.  She denies vomiting or diarrhea.  She denies fevers, chills, cp, sob, abdominal pain, dysuria, or vaginal discharge.  She states that she took two at home pregnancy tests which were positive.  She denies any other symptoms.  There are no modifying factors.    The history is provided by the patient. No language interpreter was used.    Past Medical History:  Diagnosis Date  . Headache     There are no active problems to display for this patient.   Past Surgical History:  Procedure Laterality Date  . HAND DEBRIDEMENT      OB History    Gravida Para Term Preterm AB Living   4 4 4  0 0 4   SAB TAB Ectopic Multiple Live Births   0 0   0 1       Home Medications    Prior to Admission medications   Medication Sig Start Date End Date Taking? Authorizing Provider  diphenhydrAMINE (BENADRYL) 25 MG tablet Take 1 tablet (25 mg total) by mouth every 6 (six) hours. 02/28/14   Marisa Severinlga Otter, MD  EPINEPHrine (EPIPEN 2-PAK) 0.3 mg/0.3 mL IJ SOAJ injection Inject 0.3 mLs (0.3 mg total) into the muscle once. Then go to nearest ER 02/28/14   Marisa Severinlga Otter, MD  famotidine (PEPCID) 20 MG tablet Take 1 tablet (20 mg total) by mouth 2 (two) times daily. 02/28/14   Marisa Severinlga Otter, MD  ibuprofen (ADVIL,MOTRIN) 600 MG tablet Take 1 tablet (600 mg total) by mouth once. 09/18/14   Derwood KaplanAnkit Nanavati, MD    Family History History reviewed. No pertinent family history.  Social History Social History  Substance Use Topics  . Smoking status: Current Every Day Smoker    Packs/day: 0.50    Types: Cigarettes  . Smokeless tobacco: Never Used  . Alcohol use Yes   Comment: socially     Allergies   Shellfish allergy   Review of Systems Review of Systems  Gastrointestinal: Positive for nausea.  All other systems reviewed and are negative.    Physical Exam Updated Vital Signs BP 104/82   Pulse 93   Temp 98.1 F (36.7 C) (Oral)   Resp 16   Ht 5\' 6"  (1.676 m)   Wt 89.8 kg   LMP 08/17/2015 (Exact Date)   SpO2 100%   BMI 31.96 kg/m   Physical Exam  Constitutional: She is oriented to person, place, and time. She appears well-developed and well-nourished.  HENT:  Head: Normocephalic and atraumatic.  Eyes: Conjunctivae and EOM are normal. Pupils are equal, round, and reactive to light.  Neck: Normal range of motion. Neck supple.  Cardiovascular: Normal rate and regular rhythm.  Exam reveals no gallop and no friction rub.   No murmur heard. Pulmonary/Chest: Effort normal and breath sounds normal. No respiratory distress. She has no wheezes. She has no rales. She exhibits no tenderness.  Abdominal: Soft. Bowel sounds are normal. She exhibits no distension and no mass. There is no tenderness. There is no rebound and no guarding.  No focal abdominal tenderness, no RLQ tenderness or pain at McBurney's point,  no RUQ tenderness or Murphy's sign, no left-sided abdominal tenderness, no fluid wave, or signs of peritonitis   Musculoskeletal: Normal range of motion. She exhibits no edema or tenderness.  Neurological: She is alert and oriented to person, place, and time.  Skin: Skin is warm and dry.  Psychiatric: She has a normal mood and affect. Her behavior is normal. Judgment and thought content normal.  Nursing note and vitals reviewed.    ED Treatments / Results  Labs (all labs ordered are listed, but only abnormal results are displayed) Labs Reviewed  URINALYSIS, ROUTINE W REFLEX MICROSCOPIC (NOT AT [facility]) - Abnormal; Notable for the following:       Result Value   Hgb urine dipstick SMALL (*)    All other components within normal limits    URINE MICROSCOPIC-ADD ON - Abnormal; Notable for the following:    Squamous Epithelial / LPF 6-30 (*)    Bacteria, UA FEW (*)    All other components within normal limits  CBC WITH DIFFERENTIAL/PLATELET  COMPREHENSIVE METABOLIC PANEL  LIPASE, BLOOD  POC URINE PREG, ED  I-STAT BETA HCG BLOOD, ED ([facility], [facility], [facility] ONLY)    EKG  EKG Interpretation None       Radiology No results found.  Procedures Procedures (including critical care time)  Medications Ordered in ED Medications  ondansetron (ZOFRAN-ODT) disintegrating tablet 8 mg (not administered)     Initial Impression / Assessment and Plan / ED Course  I have reviewed the triage vital signs and the nursing notes.  Pertinent labs & imaging results that were available during my care of the patient were reviewed by me and considered in my medical decision making (see chart for details).  Clinical Course    Patient had 2 positive pregnancy tests at home and has felt nauseated.  Labs are reassuring.  upreg is negative, but istat HCG is positive.  Will have patient follow-up with OBGYN.  She does not have any abdominal/pelvic pain.  She denies any vaginal discharge or bleeding.  Final Clinical Impressions(s) / ED Diagnoses   Final diagnoses:  Nausea  Pregnancy    New Prescriptions New Prescriptions   No medications on file     Roxy Horseman, PA-C 09/14/15 2316    Maia Plan, MD 09/15/15 [phone]

## 2015-11-18 LAB — OB RESULTS CONSOLE GC/CHLAMYDIA
Chlamydia: NEGATIVE
GC PROBE AMP, GENITAL: NEGATIVE

## 2015-11-18 LAB — OB RESULTS CONSOLE HEPATITIS B SURFACE ANTIGEN: Hepatitis B Surface Ag: NEGATIVE

## 2015-11-18 LAB — OB RESULTS CONSOLE ANTIBODY SCREEN: Antibody Screen: NEGATIVE

## 2015-11-18 LAB — OB RESULTS CONSOLE ABO/RH: RH Type: POSITIVE

## 2015-11-18 LAB — OB RESULTS CONSOLE RUBELLA ANTIBODY, IGM: Rubella: IMMUNE

## 2015-11-18 LAB — OB RESULTS CONSOLE HIV ANTIBODY (ROUTINE TESTING): HIV: NONREACTIVE

## 2015-11-18 LAB — OB RESULTS CONSOLE RPR: RPR: NONREACTIVE

## 2016-01-11 NOTE — L&D Delivery Note (Signed)
Patient was C/C/+2 and pushed for <1 minute with epidural.   NSVD female infant, Apgars 9/9, weight pending.   The patient had no laceration. Fundus was firm. EBL was essentially absent. Placenta was delivered intact. Vagina was clear.  Baby was vigorous and doing skin to skin with mother.  Philip AspenALLAHAN, Theresa Banks

## 2016-04-26 ENCOUNTER — Other Ambulatory Visit: Payer: Self-pay | Admitting: Obstetrics and Gynecology

## 2016-04-26 LAB — OB RESULTS CONSOLE GBS: STREP GROUP B AG: POSITIVE

## 2016-04-27 IMAGING — US US PELVIS COMPLETE
1 series · 14 of 25 positions shown · non-contrast
Comparison: None

CLINICAL DATA: Left pelvic pain left flank pain

EXAM:
TRANSABDOMINAL AND TRANSVAGINAL ULTRASOUND OF PELVIS
TECHNIQUE: Both transabdominal and transvaginal ultrasound examinations of the
pelvis were performed. Transabdominal technique was performed for
global imaging of the pelvis including uterus, ovaries, adnexal
regions, and pelvic cul-de-sac. It was necessary to proceed with
endovaginal exam following the transabdominal exam to visualize the
uterus and ovaries.

[Series 1: us pelvis complete · 0.21mm/px · 14 of 49 slices shown]
[im 1/49]
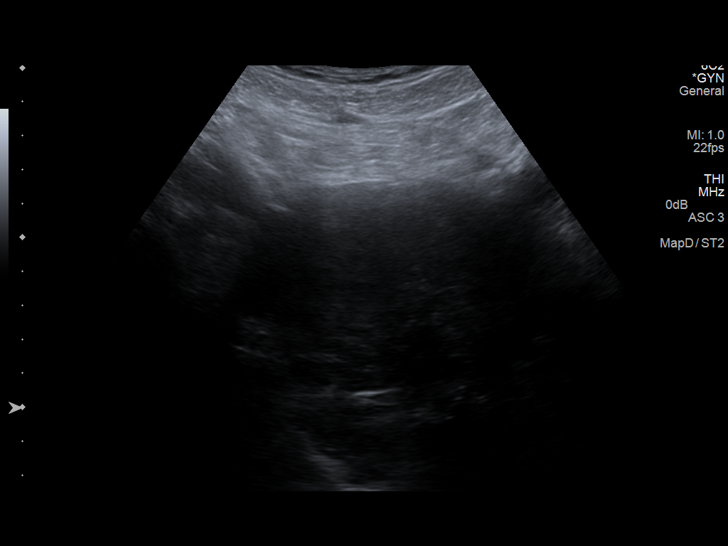
[im 5/49]
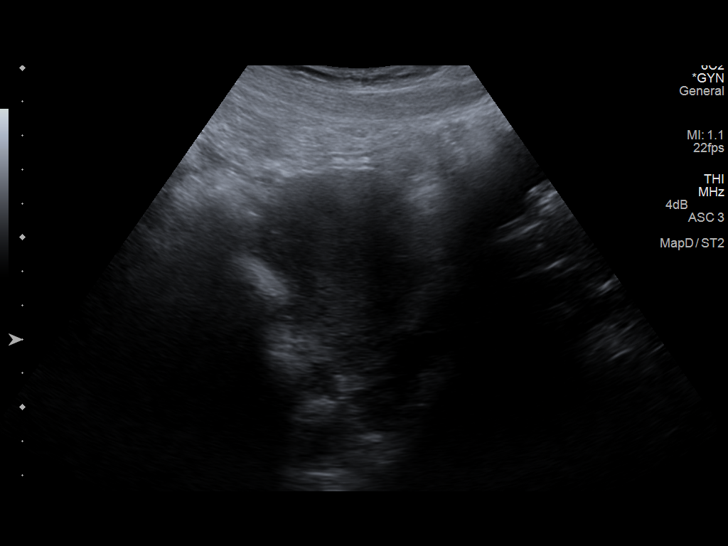
[im 9/49]
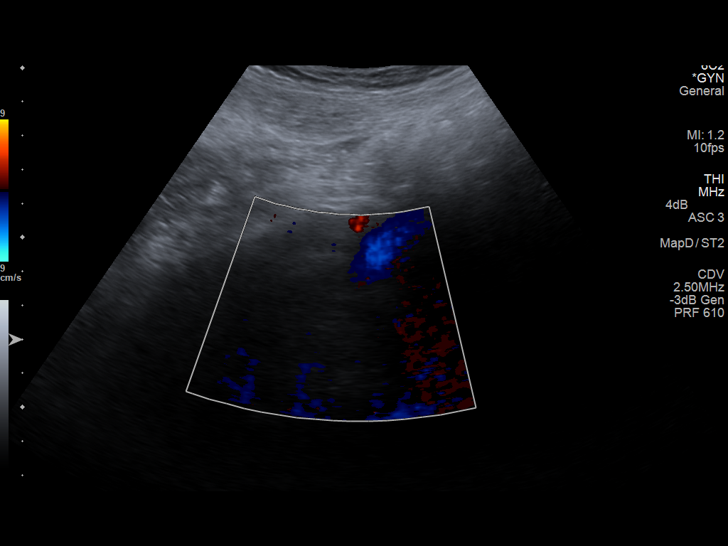
[im 13/49]
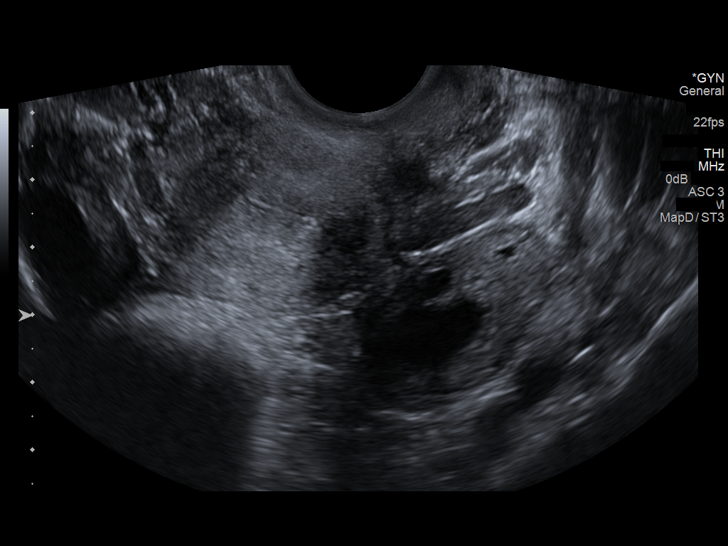
[im 17/49]
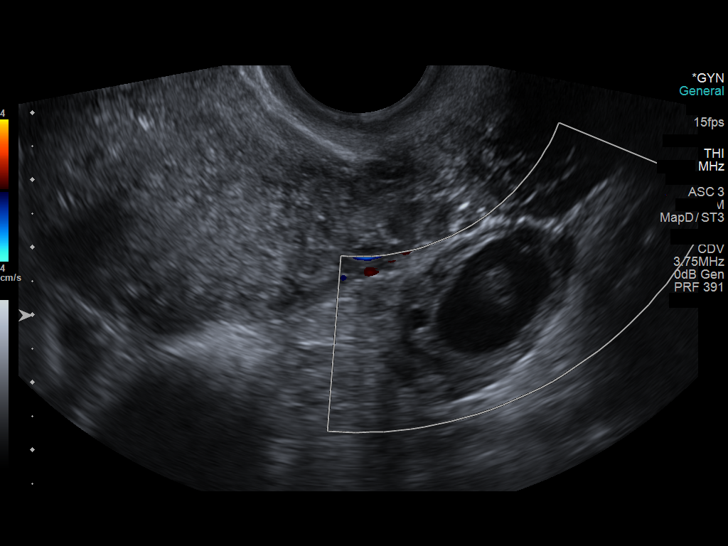
[im 19/49]
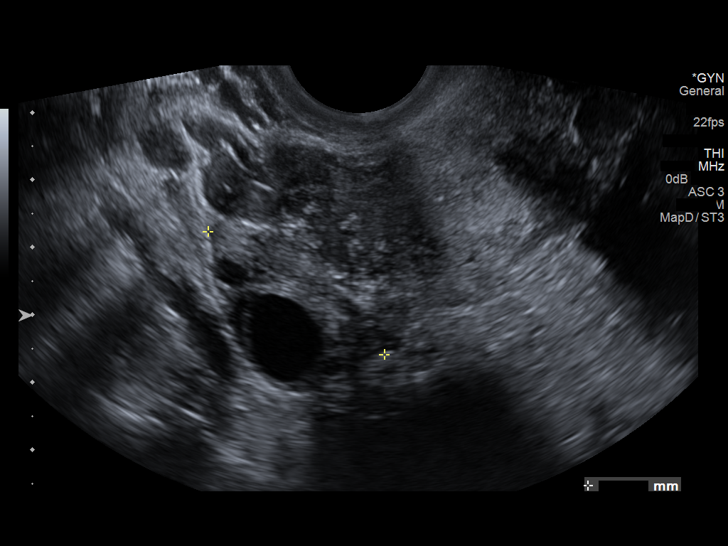
[im 23/49]
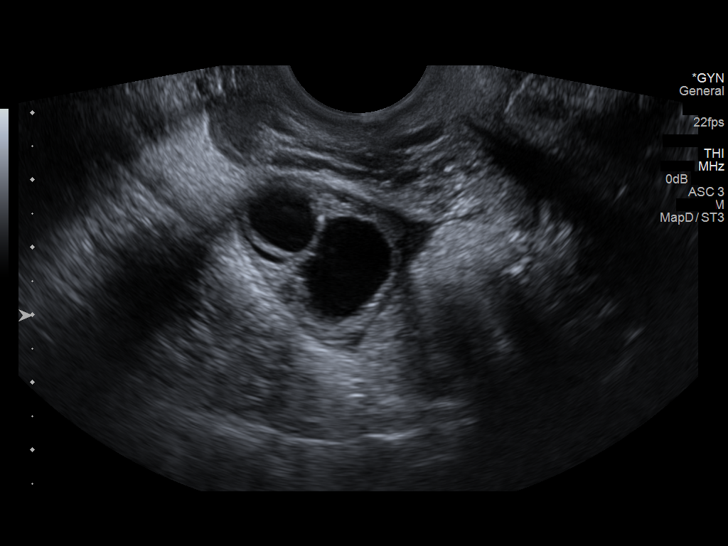
[im 27/49]
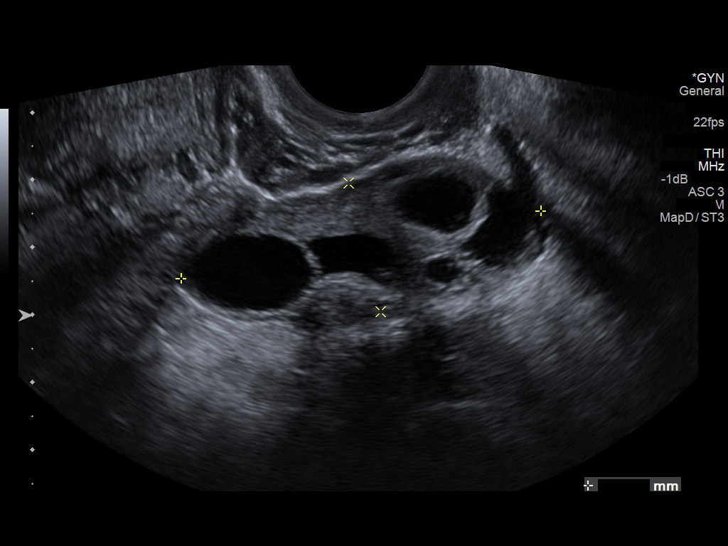
[im 31/49]
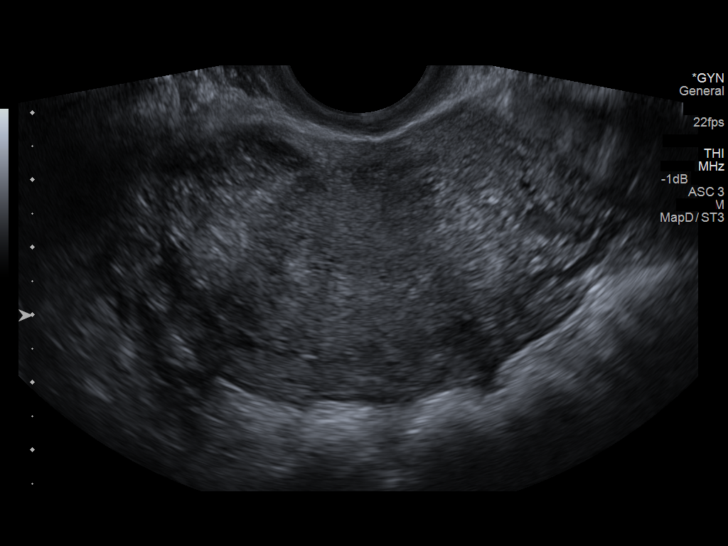
[im 33/49]
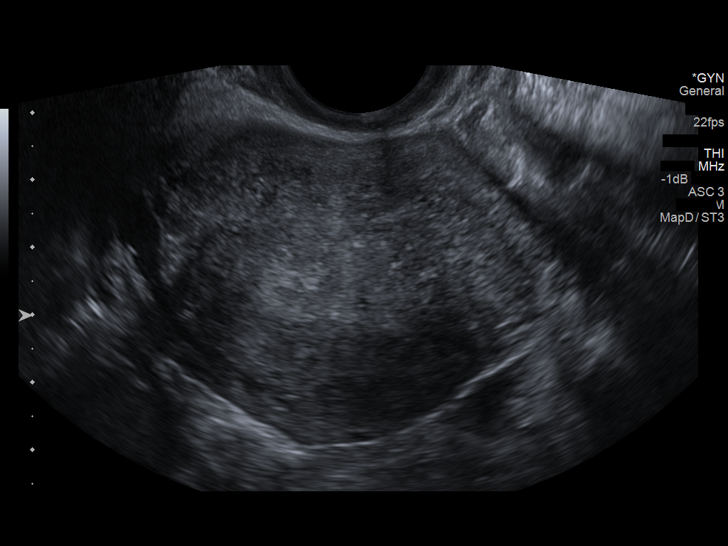
[im 37/49]
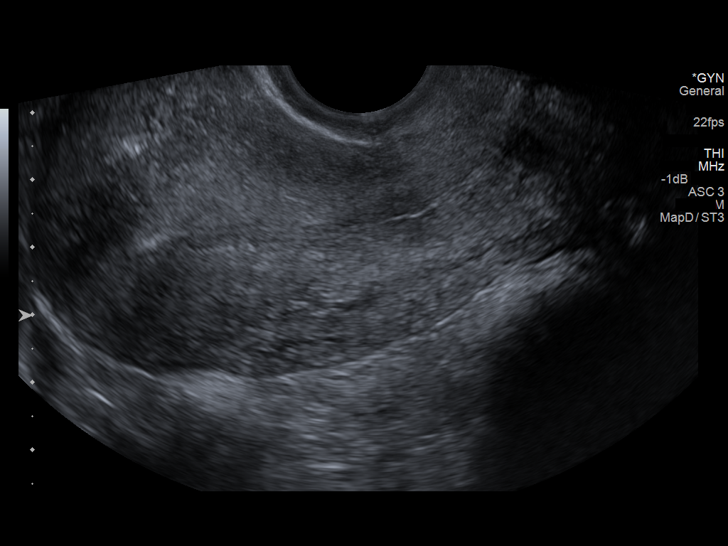
[im 41/49]
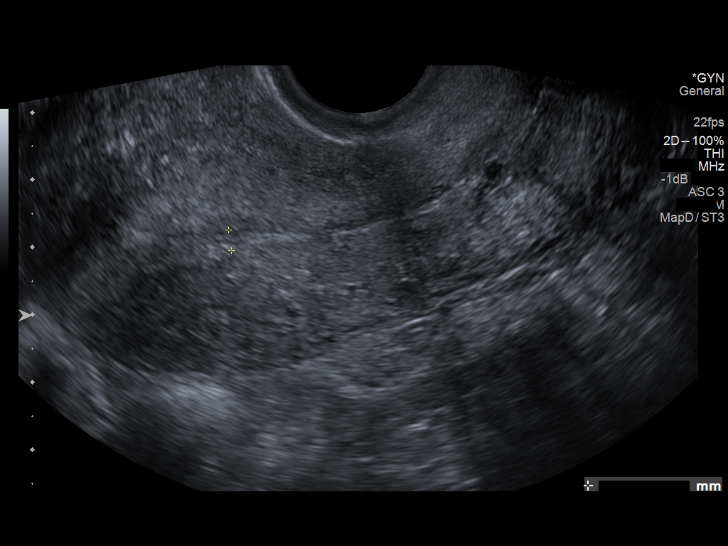
[im 45/49]
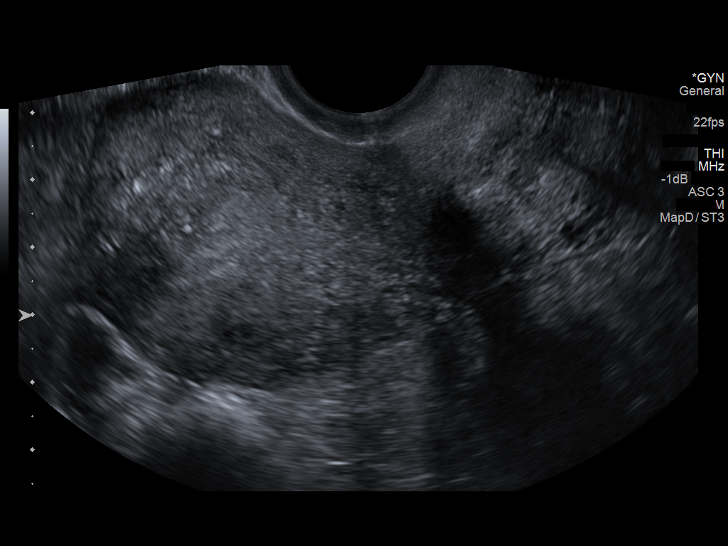
[im 49/49]
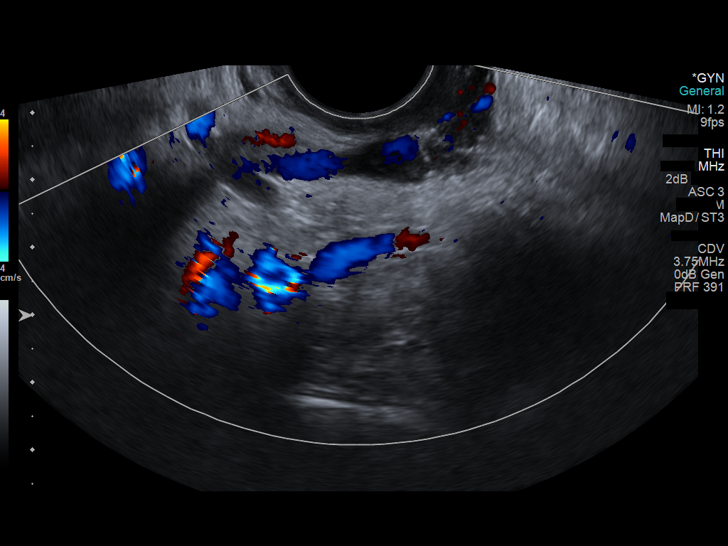

[14 of 25 positions shown; findings below may reference images not displayed]

FINDINGS: Uterus

Measurements: 9.5 x 4.3 x 6.1 Cm. No fibroids or other mass
visualized.

Endometrium

Thickness: 3 mm.  No focal abnormality visualized.

Right ovary

Measurements: 54 x 20 x 32 mm. Normal appearance/no adnexal mass.
Numerous follicles measuring up to 2 cm.

Left ovary

Measurements: 31 x 20 x 31 mm. Normal appearance/no adnexal mass.
Numerous follicles measuring up to 2 cm.

Other findings

Trace fluid in the cul-de-sac
IMPRESSION: Numerous ovarian follicles.

Trace free fluid.

Uterus appears prominent but without focal mass.

## 2016-05-09 ENCOUNTER — Encounter (HOSPITAL_COMMUNITY): Payer: Self-pay | Admitting: *Deleted

## 2016-05-09 ENCOUNTER — Inpatient Hospital Stay (HOSPITAL_COMMUNITY)
Admission: AD | Admit: 2016-05-09 | Discharge: 2016-05-09 | Disposition: A | Discharge status: Home / Self Care | Payer: Medicaid Other | Source: Ambulatory Visit | Attending: Obstetrics & Gynecology | Admitting: Obstetrics & Gynecology

## 2016-05-09 DIAGNOSIS — Z3A37 37 weeks gestation of pregnancy: Secondary | ICD-10-CM | POA: Insufficient documentation

## 2016-05-09 DIAGNOSIS — O0943 Supervision of pregnancy with grand multiparity, third trimester: Secondary | ICD-10-CM | POA: Diagnosis not present

## 2016-05-09 DIAGNOSIS — O133 Gestational [pregnancy-induced] hypertension without significant proteinuria, third trimester: Secondary | ICD-10-CM | POA: Diagnosis not present

## 2016-05-09 DIAGNOSIS — F17210 Nicotine dependence, cigarettes, uncomplicated: Secondary | ICD-10-CM | POA: Diagnosis not present

## 2016-05-09 DIAGNOSIS — O99333 Smoking (tobacco) complicating pregnancy, third trimester: Secondary | ICD-10-CM | POA: Diagnosis not present

## 2016-05-09 DIAGNOSIS — O139 Gestational [pregnancy-induced] hypertension without significant proteinuria, unspecified trimester: Secondary | ICD-10-CM | POA: Diagnosis not present

## 2016-05-09 DIAGNOSIS — O0940 Supervision of pregnancy with grand multiparity, unspecified trimester: Secondary | ICD-10-CM

## 2016-05-09 DIAGNOSIS — Z91013 Allergy to seafood: Secondary | ICD-10-CM | POA: Insufficient documentation

## 2016-05-09 DIAGNOSIS — I10 Essential (primary) hypertension: Secondary | ICD-10-CM | POA: Diagnosis present

## 2016-05-09 LAB — COMPREHENSIVE METABOLIC PANEL
ALBUMIN: 3 g/dL — AB (ref 3.5–5.0)
ALT: 7 U/L — AB (ref 14–54)
AST: 17 U/L (ref 15–41)
Alkaline Phosphatase: 80 U/L (ref 38–126)
Anion gap: 7 (ref 5–15)
BUN: <5 mg/dL — ABNORMAL LOW (ref 6–20)
CHLORIDE: 110 mmol/L (ref 101–111)
CO2: 19 mmol/L — AB (ref 22–32)
CREATININE: 0.53 mg/dL (ref 0.44–1.00)
Calcium: 8.8 mg/dL — ABNORMAL LOW (ref 8.9–10.3)
GFR calc non Af Amer: >60 mL/min (ref >60)
Glucose, Bld: 94 mg/dL (ref 65–99)
Potassium: 3.3 mmol/L — ABNORMAL LOW (ref 3.5–5.1)
SODIUM: 136 mmol/L (ref 135–145)
Total Bilirubin: 0.4 mg/dL (ref 0.3–1.2)
Total Protein: 6.5 g/dL (ref 6.5–8.1)

## 2016-05-09 LAB — PROTEIN / CREATININE RATIO, URINE
Creatinine, Urine: 67 mg/dL
PROTEIN CREATININE RATIO: 0.12 mg/mg{creat} (ref 0.00–0.15)
TOTAL PROTEIN, URINE: 8 mg/dL

## 2016-05-09 LAB — CBC
HCT: 29.9 % — ABNORMAL LOW (ref 36.0–46.0)
Hemoglobin: 10 g/dL — ABNORMAL LOW (ref 12.0–15.0)
MCH: 30.1 pg (ref 26.0–34.0)
MCHC: 33.4 g/dL (ref 30.0–36.0)
MCV: 90.1 fL (ref 78.0–100.0)
PLATELETS: 191 10*3/uL (ref 150–400)
RBC: 3.32 MIL/uL — AB (ref 3.87–5.11)
RDW: 13.8 % (ref 11.5–15.5)
WBC: 7.2 10*3/uL (ref 4.0–10.5)

## 2016-05-09 LAB — URINALYSIS, ROUTINE W REFLEX MICROSCOPIC
Bilirubin Urine: NEGATIVE
GLUCOSE, UA: NEGATIVE mg/dL
HGB URINE DIPSTICK: NEGATIVE
Ketones, ur: NEGATIVE mg/dL
NITRITE: NEGATIVE
Protein, ur: NEGATIVE mg/dL
SPECIFIC GRAVITY, URINE: 1.006 (ref 1.005–1.030)
pH: 7 (ref 5.0–8.0)

## 2016-05-09 NOTE — Discharge Instructions (Signed)
GO TO [facility] IMMEDIATELY FOR ANY SIGNS/SYMPTOMS LISTED IN PREECLAMPSIA INFORMATION

## 2016-05-09 NOTE — MAU Note (Signed)
Patient sent from ob office for further evaluation of elevated bp. Patient states that this was the first episode of high blood pressure; has not happened with past pregnancies. Denies headache, change in vision, epigastric pain, or increase in swelling. Denies vaginal bleeding, discharge or leaking clear fluid. +FM

## 2016-05-09 NOTE — MAU Provider Note (Signed)
History     CSN: [id number]  Arrival date and time: 05/09/16 1200   First Provider Initiated Contact with Patient 05/09/16 1314      Chief Complaint  Patient presents with  . Hypertension   [name] is a 36 yo G5P4004 at 37.[redacted] wks gestation sent today from the office for PEC work-up.  She had one elevated BP at her ROB appt today. She denies H/A, visual changes, epigastric pain, or swelling. She also denies any vaginal d/c, VB or LOF. She reports (+) FM.  Hypertension  This is a new problem. The current episode started today. The problem has been gradually improving since onset. The problem is controlled. There are no associated agents to hypertension. There are no known risk factors for coronary artery disease. Past treatments include nothing. There are no compliance problems.     Past Medical History:  Diagnosis Date  . Headache     Past Surgical History:  Procedure Laterality Date  . HAND DEBRIDEMENT      History reviewed. No pertinent family history.  Social History  Substance Use Topics  . Smoking status: Current Every Day Smoker    Packs/day: 0.50    Types: Cigarettes  . Smokeless tobacco: Never Used  . Alcohol use Yes     Comment: socially    Allergies:  Allergies  Allergen Reactions  . Shellfish Allergy Anaphylaxis    Prescriptions Prior to Admission  Medication Sig Dispense Refill Last Dose  . diphenhydrAMINE (BENADRYL) 25 MG tablet Take 1 tablet (25 mg total) by mouth every 6 (six) hours. (Patient not taking: Reported on 09/14/2015) 20 tablet 0 Not Taking at Unknown time  . EPINEPHrine (EPIPEN 2-PAK) 0.3 mg/0.3 mL IJ SOAJ injection Inject 0.3 mLs (0.3 mg total) into the muscle once. Then go to nearest ER 1 Device 1 unknown at Unknown time  . famotidine (PEPCID) 20 MG tablet Take 1 tablet (20 mg total) by mouth 2 (two) times daily. (Patient not taking: Reported on 09/14/2015) 30 tablet 0 Not Taking at Unknown time  . ibuprofen  (ADVIL,MOTRIN) 600 MG tablet Take 1 tablet (600 mg total) by mouth once. (Patient not taking: Reported on 09/14/2015) 30 tablet 0 Not Taking at Unknown time  . vitamin B-6 (PYRIDOXINE) 25 MG tablet Take 1 tablet (25 mg total) by mouth 3 (three) times daily. 30 tablet 0     Review of Systems  Constitutional: Negative.   HENT: Negative.   Eyes: Negative.   Respiratory: Negative.   Cardiovascular: Negative.   Gastrointestinal: Negative.   Genitourinary: Negative for pelvic pain, vaginal bleeding and vaginal discharge.  Musculoskeletal: Negative.   Skin: Negative.   Allergic/Immunologic: Negative.   Neurological: Negative.   Hematological: Negative.   Psychiatric/Behavioral: Negative.    CEFM  FHR: 135 bpm / moderate variability / accels present / decels absent TOCO: none  Results for orders placed or performed during the hospital encounter of 05/09/16 (from the past 24 hour(s))  Urinalysis, Routine w reflex microscopic     Status: Abnormal   Collection Time: 05/09/16 12:06 PM  Result Value Ref Range   Color, Urine YELLOW YELLOW   APPearance CLEAR CLEAR   Specific Gravity, Urine 1.006 1.005 - 1.030   pH 7.0 5.0 - 8.0   Glucose, UA NEGATIVE NEGATIVE mg/dL   Hgb urine dipstick NEGATIVE NEGATIVE   Bilirubin Urine NEGATIVE NEGATIVE   Ketones, ur NEGATIVE NEGATIVE mg/dL   Protein, ur NEGATIVE NEGATIVE mg/dL   Nitrite NEGATIVE NEGATIVE  Leukocytes, UA TRACE (A) NEGATIVE   RBC / HPF 0-5 0 - 5 RBC/hpf   WBC, UA 0-5 0 - 5 WBC/hpf   Bacteria, UA MANY (A) NONE SEEN   Squamous Epithelial / LPF 0-5 (A) NONE SEEN   Mucous PRESENT   Protein / creatinine ratio, urine     Status: None   Collection Time: 05/09/16 12:06 PM  Result Value Ref Range   Creatinine, Urine 67.00 mg/dL   Total Protein, Urine 8 mg/dL   Protein Creatinine Ratio 0.12 0.00 - 0.15 mg/mg[Cre]  CBC     Status: Abnormal   Collection Time: 05/09/16 12:39 PM  Result Value Ref Range   WBC 7.2 4.0 - 10.5 K/uL   RBC 3.32 (L)  3.87 - 5.11 MIL/uL   Hemoglobin 10.0 (L) 12.0 - 15.0 g/dL   HCT 40.9 (L) 81.1 - 91.4 %   MCV 90.1 78.0 - 100.0 fL   MCH 30.1 26.0 - 34.0 pg   MCHC 33.4 30.0 - 36.0 g/dL   RDW 78.2 95.6 - 21.3 %   Platelets 191 150 - 400 K/uL  Comprehensive metabolic panel     Status: Abnormal   Collection Time: 05/09/16 12:39 PM  Result Value Ref Range   Sodium 136 135 - 145 mmol/L   Potassium 3.3 (L) 3.5 - 5.1 mmol/L   Chloride 110 101 - 111 mmol/L   CO2 19 (L) 22 - 32 mmol/L   Glucose, Bld 94 65 - 99 mg/dL   BUN <5 (L) 6 - 20 mg/dL   Creatinine, Ser 0.86 0.44 - 1.00 mg/dL   Calcium 8.8 (L) 8.9 - 10.3 mg/dL   Total Protein 6.5 6.5 - 8.1 g/dL   Albumin 3.0 (L) 3.5 - 5.0 g/dL   AST 17 15 - 41 U/L   ALT 7 (L) 14 - 54 U/L   Alkaline Phosphatase 80 38 - 126 U/L   Total Bilirubin 0.4 0.3 - 1.2 mg/dL   GFR calc non Af Amer >60 >60 mL/min   GFR calc Af Amer >60 >60 mL/min   Anion gap 7 5 - 15   Physical Exam   Blood pressure 133/77, pulse 82, temperature 98.1 F (36.7 C), temperature source Oral, resp. rate 18, weight 94.4 kg (208 lb 1.9 oz), last menstrual period 08/17/2015, SpO2 98 %.  Physical Exam  Constitutional: She is oriented to person, place, and time. She appears well-developed and well-nourished.  HENT:  Head: Normocephalic.  Eyes: Pupils are equal, round, and reactive to light.  Neck: Normal range of motion.  Cardiovascular: Normal rate, regular rhythm, normal heart sounds and intact distal pulses.   Respiratory: Effort normal and breath sounds normal.  GI: Soft. Bowel sounds are normal.  Genitourinary:  Genitourinary Comments: Gravid, S=D, non-tender, VE: deferred  Musculoskeletal: Normal range of motion.  Neurological: She is alert and oriented to person, place, and time. She has normal reflexes.  Skin: Skin is warm and dry.  Psychiatric: She has a normal mood and affect. Her behavior is normal. Judgment and thought content normal.    MAU Course   Procedures  MDM NST CBC CMP CCUA P/C ratio Serial BPs Consult with Dr. Mora Appl @ 1423: given PEC labs results, no s/s PEC, ok to d/c home with f/u appt  Assessment and Plan  36 yo G5P4004 at 37.[redacted] wks gestation Gestational hypertension affecting fifth pregnancy  - Discharge Home - Educated about elevating BLE as much as possible, PEC s/s to immediately report to [facility] for -  Follow-up at [facility] Thursday 05/12/2016 as scheduled  Raelyn Mora MSN, CNM 05/09/2016, 1:22 PM

## 2016-05-16 ENCOUNTER — Telehealth (HOSPITAL_COMMUNITY): Payer: Self-pay | Admitting: *Deleted

## 2016-05-16 ENCOUNTER — Encounter (HOSPITAL_COMMUNITY): Payer: Self-pay | Admitting: *Deleted

## 2016-05-16 NOTE — Telephone Encounter (Signed)
Preadmission screen  

## 2016-05-17 ENCOUNTER — Inpatient Hospital Stay (HOSPITAL_COMMUNITY): Payer: Medicaid Other | Admitting: Anesthesiology

## 2016-05-17 ENCOUNTER — Inpatient Hospital Stay (HOSPITAL_COMMUNITY)
Admission: AD | Admit: 2016-05-17 | Discharge: 2016-05-19 | DRG: 775 | Disposition: A | Discharge status: Home / Self Care | Payer: Medicaid Other | Source: Ambulatory Visit | Attending: Obstetrics | Admitting: Obstetrics

## 2016-05-17 ENCOUNTER — Encounter (HOSPITAL_COMMUNITY): Payer: Self-pay | Admitting: *Deleted

## 2016-05-17 DIAGNOSIS — O4202 Full-term premature rupture of membranes, onset of labor within 24 hours of rupture: Secondary | ICD-10-CM | POA: Diagnosis present

## 2016-05-17 DIAGNOSIS — O99824 Streptococcus B carrier state complicating childbirth: Secondary | ICD-10-CM | POA: Diagnosis present

## 2016-05-17 DIAGNOSIS — F17210 Nicotine dependence, cigarettes, uncomplicated: Secondary | ICD-10-CM | POA: Diagnosis present

## 2016-05-17 DIAGNOSIS — Z3A38 38 weeks gestation of pregnancy: Secondary | ICD-10-CM | POA: Diagnosis not present

## 2016-05-17 DIAGNOSIS — O99334 Smoking (tobacco) complicating childbirth: Secondary | ICD-10-CM | POA: Diagnosis present

## 2016-05-17 DIAGNOSIS — Z3483 Encounter for supervision of other normal pregnancy, third trimester: Secondary | ICD-10-CM

## 2016-05-17 LAB — TYPE AND SCREEN
ABO/RH(D): O POS
Antibody Screen: NEGATIVE

## 2016-05-17 LAB — COMPREHENSIVE METABOLIC PANEL
ALT: 7 U/L — ABNORMAL LOW (ref 14–54)
ANION GAP: 8 (ref 5–15)
AST: 18 U/L (ref 15–41)
Albumin: 3.2 g/dL — ABNORMAL LOW (ref 3.5–5.0)
Alkaline Phosphatase: 100 U/L (ref 38–126)
BILIRUBIN TOTAL: 0.7 mg/dL (ref 0.3–1.2)
CHLORIDE: 109 mmol/L (ref 101–111)
CO2: 18 mmol/L — ABNORMAL LOW (ref 22–32)
Calcium: 8.9 mg/dL (ref 8.9–10.3)
Creatinine, Ser: 0.57 mg/dL (ref 0.44–1.00)
Glucose, Bld: 78 mg/dL (ref 65–99)
POTASSIUM: 3.7 mmol/L (ref 3.5–5.1)
Sodium: 135 mmol/L (ref 135–145)
TOTAL PROTEIN: 6.8 g/dL (ref 6.5–8.1)

## 2016-05-17 LAB — CBC
HCT: 32.3 % — ABNORMAL LOW (ref 36.0–46.0)
HEMOGLOBIN: 10.9 g/dL — AB (ref 12.0–15.0)
MCH: 30 pg (ref 26.0–34.0)
MCHC: 33.7 g/dL (ref 30.0–36.0)
MCV: 89 fL (ref 78.0–100.0)
Platelets: 207 10*3/uL (ref 150–400)
RBC: 3.63 MIL/uL — ABNORMAL LOW (ref 3.87–5.11)
RDW: 13.7 % (ref 11.5–15.5)
WBC: 8 10*3/uL (ref 4.0–10.5)

## 2016-05-17 LAB — POCT FERN TEST: POCT FERN TEST: POSITIVE

## 2016-05-17 MED ORDER — OXYTOCIN 40 UNITS IN LACTATED RINGERS INFUSION - SIMPLE MED
2.5000 [IU]/h | INTRAVENOUS | Status: DC
Start: 2016-05-17 — End: 2016-05-17

## 2016-05-17 MED ORDER — ACETAMINOPHEN 325 MG PO TABS: 650.0000 mg | ORAL_TABLET | ORAL | Status: DC | PRN

## 2016-05-17 MED ORDER — LACTATED RINGERS IV SOLN: 500.0000 mL | INTRAVENOUS | Status: DC | PRN

## 2016-05-17 MED ORDER — TERBUTALINE SULFATE 1 MG/ML IJ SOLN
0.2500 mg | Freq: Once | INTRAMUSCULAR | Status: DC | PRN
  Filled 2016-05-17: qty 1

## 2016-05-17 MED ORDER — PENICILLIN G POT IN DEXTROSE 60000 UNIT/ML IV SOLN
3.0000 10*6.[IU] | INTRAVENOUS | Status: DC
  Administered 2016-05-17 (×2): 3 10*6.[IU] via INTRAVENOUS
  Filled 2016-05-17 (×5): qty 50

## 2016-05-17 MED ORDER — WITCH HAZEL-GLYCERIN EX PADS: 1.0000 "application " | MEDICATED_PAD | CUTANEOUS | Status: DC | PRN

## 2016-05-17 MED ORDER — EPHEDRINE 5 MG/ML INJ
10.0000 mg | INTRAVENOUS | Status: DC | PRN
  Filled 2016-05-17: qty 2

## 2016-05-17 MED ORDER — LACTATED RINGERS IV SOLN
INTRAVENOUS | Status: DC
  Administered 2016-05-17: 10:00:00 via INTRAVENOUS

## 2016-05-17 MED ORDER — SENNOSIDES-DOCUSATE SODIUM 8.6-50 MG PO TABS
2.0000 | ORAL_TABLET | ORAL | Status: DC
  Administered 2016-05-17: 2 via ORAL
  Filled 2016-05-17 (×2): qty 2

## 2016-05-17 MED ORDER — COCONUT OIL OIL: 1.0000 "application " | TOPICAL_OIL | Status: DC | PRN

## 2016-05-17 MED ORDER — OXYTOCIN BOLUS FROM INFUSION: 500.0000 mL | Freq: Once | INTRAVENOUS | Status: DC

## 2016-05-17 MED ORDER — OXYCODONE-ACETAMINOPHEN 5-325 MG PO TABS: 1.0000 | ORAL_TABLET | ORAL | Status: DC | PRN

## 2016-05-17 MED ORDER — FENTANYL 2.5 MCG/ML BUPIVACAINE 1/10 % EPIDURAL INFUSION (WH - ANES)
14.0000 mL/h | INTRAMUSCULAR | Status: DC | PRN
  Administered 2016-05-17: 14 mL/h via EPIDURAL
  Filled 2016-05-17: qty 100

## 2016-05-17 MED ORDER — ONDANSETRON HCL 4 MG/2ML IJ SOLN: 4.0000 mg | Freq: Four times a day (QID) | INTRAMUSCULAR | Status: DC | PRN

## 2016-05-17 MED ORDER — LIDOCAINE HCL (PF) 1 % IJ SOLN
INTRAMUSCULAR | Status: DC | PRN
  Administered 2016-05-17 (×2): 6 mL via EPIDURAL

## 2016-05-17 MED ORDER — LIDOCAINE HCL (PF) 1 % IJ SOLN: 30.0000 mL | INTRAMUSCULAR | Status: DC | PRN

## 2016-05-17 MED ORDER — ZOLPIDEM TARTRATE 5 MG PO TABS: 5.0000 mg | ORAL_TABLET | Freq: Every evening | ORAL | Status: DC | PRN

## 2016-05-17 MED ORDER — DIPHENHYDRAMINE HCL 25 MG PO CAPS: 25.0000 mg | ORAL_CAPSULE | Freq: Four times a day (QID) | ORAL | Status: DC | PRN

## 2016-05-17 MED ORDER — LACTATED RINGERS IV SOLN
500.0000 mL | Freq: Once | INTRAVENOUS | Status: AC
  Administered 2016-05-17: 500 mL via INTRAVENOUS

## 2016-05-17 MED ORDER — SOD CITRATE-CITRIC ACID 500-334 MG/5ML PO SOLN: 30.0000 mL | ORAL | Status: DC | PRN

## 2016-05-17 MED ORDER — LACTATED RINGERS IV SOLN: 500.0000 mL | Freq: Once | INTRAVENOUS | Status: DC

## 2016-05-17 MED ORDER — OXYTOCIN 40 UNITS IN LACTATED RINGERS INFUSION - SIMPLE MED
2.5000 [IU]/h | INTRAVENOUS | Status: DC
  Filled 2016-05-17: qty 1000

## 2016-05-17 MED ORDER — ACETAMINOPHEN 325 MG PO TABS
650.0000 mg | ORAL_TABLET | ORAL | Status: DC | PRN
  Administered 2016-05-18 – 2016-05-19 (×2): 650 mg via ORAL
  Filled 2016-05-17 (×2): qty 2

## 2016-05-17 MED ORDER — SIMETHICONE 80 MG PO CHEW: 80.0000 mg | CHEWABLE_TABLET | ORAL | Status: DC | PRN

## 2016-05-17 MED ORDER — ONDANSETRON HCL 4 MG PO TABS
4.0000 mg | ORAL_TABLET | ORAL | Status: DC | PRN
  Administered 2016-05-19: 4 mg via ORAL
  Filled 2016-05-17: qty 1

## 2016-05-17 MED ORDER — ONDANSETRON HCL 4 MG/2ML IJ SOLN
4.0000 mg | INTRAMUSCULAR | Status: DC | PRN
  Administered 2016-05-18: 4 mg via INTRAVENOUS
  Filled 2016-05-17: qty 2

## 2016-05-17 MED ORDER — TETANUS-DIPHTH-ACELL PERTUSSIS 5-2.5-18.5 LF-MCG/0.5 IM SUSP: 0.5000 mL | Freq: Once | INTRAMUSCULAR | Status: DC

## 2016-05-17 MED ORDER — BENZOCAINE-MENTHOL 20-0.5 % EX AERO: 1.0000 "application " | INHALATION_SPRAY | CUTANEOUS | Status: DC | PRN

## 2016-05-17 MED ORDER — PHENYLEPHRINE 40 MCG/ML (10ML) SYRINGE FOR IV PUSH (FOR BLOOD PRESSURE SUPPORT)
80.0000 ug | PREFILLED_SYRINGE | INTRAVENOUS | Status: DC | PRN
  Filled 2016-05-17: qty 5

## 2016-05-17 MED ORDER — IBUPROFEN 600 MG PO TABS
600.0000 mg | ORAL_TABLET | Freq: Four times a day (QID) | ORAL | Status: DC
Start: 2016-05-17 — End: 2016-05-19
  Administered 2016-05-17 – 2016-05-19 (×7): 600 mg via ORAL
  Filled 2016-05-17 (×8): qty 1

## 2016-05-17 MED ORDER — OXYTOCIN 40 UNITS IN LACTATED RINGERS INFUSION - SIMPLE MED: 1.0000 m[IU]/min | INTRAVENOUS | Status: DC

## 2016-05-17 MED ORDER — DIPHENHYDRAMINE HCL 50 MG/ML IJ SOLN: 12.5000 mg | INTRAMUSCULAR | Status: DC | PRN

## 2016-05-17 MED ORDER — PRENATAL MULTIVITAMIN CH
1.0000 | ORAL_TABLET | Freq: Every day | ORAL | Status: DC
  Administered 2016-05-18 – 2016-05-19 (×2): 1 via ORAL
  Filled 2016-05-17 (×2): qty 1

## 2016-05-17 MED ORDER — OXYCODONE-ACETAMINOPHEN 5-325 MG PO TABS: 2.0000 | ORAL_TABLET | ORAL | Status: DC | PRN

## 2016-05-17 MED ORDER — DIBUCAINE 1 % RE OINT: 1.0000 "application " | TOPICAL_OINTMENT | RECTAL | Status: DC | PRN

## 2016-05-17 MED ORDER — FLEET ENEMA 7-19 GM/118ML RE ENEM: 1.0000 | ENEMA | RECTAL | Status: DC | PRN

## 2016-05-17 MED ORDER — OXYTOCIN 40 UNITS IN LACTATED RINGERS INFUSION - SIMPLE MED
1.0000 m[IU]/min | INTRAVENOUS | Status: DC
  Administered 2016-05-17: 2 m[IU]/min via INTRAVENOUS

## 2016-05-17 MED ORDER — PENICILLIN G POTASSIUM 5000000 UNITS IJ SOLR: 5.0000 10*6.[IU] | Freq: Once | INTRAVENOUS | Status: DC

## 2016-05-17 MED ORDER — OXYTOCIN BOLUS FROM INFUSION
500.0000 mL | Freq: Once | INTRAVENOUS | Status: AC
  Administered 2016-05-17: 500 mL via INTRAVENOUS

## 2016-05-17 MED ORDER — FENTANYL CITRATE (PF) 100 MCG/2ML IJ SOLN: 50.0000 ug | INTRAMUSCULAR | Status: DC | PRN

## 2016-05-17 MED ORDER — PENICILLIN G POTASSIUM 5000000 UNITS IJ SOLR
5.0000 10*6.[IU] | Freq: Once | INTRAVENOUS | Status: AC
  Administered 2016-05-17: 5 10*6.[IU] via INTRAVENOUS
  Filled 2016-05-17: qty 5

## 2016-05-17 MED ORDER — LIDOCAINE HCL (PF) 1 % IJ SOLN
30.0000 mL | INTRAMUSCULAR | Status: DC | PRN
  Filled 2016-05-17: qty 30

## 2016-05-17 MED ORDER — LACTATED RINGERS IV SOLN: INTRAVENOUS | Status: DC

## 2016-05-17 MED ORDER — PHENYLEPHRINE 40 MCG/ML (10ML) SYRINGE FOR IV PUSH (FOR BLOOD PRESSURE SUPPORT)
80.0000 ug | PREFILLED_SYRINGE | INTRAVENOUS | Status: DC | PRN
  Filled 2016-05-17: qty 5
  Filled 2016-05-17: qty 10

## 2016-05-17 MED ORDER — PENICILLIN G POT IN DEXTROSE 60000 UNIT/ML IV SOLN: 3.0000 10*6.[IU] | INTRAVENOUS | Status: DC

## 2016-05-17 NOTE — MAU Note (Signed)
PT  SAYS SHE WAS LAYING  DOWN AT 0315-     TO B-ROOM  -   THEN  HAD   TRICKLE  OF  FLUID   AND  STILL  FEELS  FLUID  COMING  OUT.        PNC-   WITH  [facility] [address] CM.-   INDUCTION ON Monday.   DENIES HSV AND  MRSA.  GBS- POSITIVE

## 2016-05-17 NOTE — Anesthesia Preprocedure Evaluation (Signed)
Anesthesia Evaluation  Patient identified by MRN, date of birth, ID band Patient awake    Reviewed: Allergy & Precautions, NPO status , Patient's Chart, lab work & pertinent test results  History of Anesthesia Complications Negative for: history of anesthetic complications  Airway Mallampati: III  TM Distance: >3 FB Neck ROM: Full    Dental  (+) Dental Advisory Given   Pulmonary Current Smoker,    breath sounds clear to auscultation       Cardiovascular hypertension (PIH),  Rhythm:Regular Rate:Normal     Neuro/Psych  Headaches,    GI/Hepatic Neg liver ROS, GERD  ,  Endo/Other  negative endocrine ROS  Renal/GU negative Renal ROS     Musculoskeletal   Abdominal   Peds  Hematology plt 207k   Anesthesia Other Findings   Reproductive/Obstetrics (+) Pregnancy                             Anesthesia Physical Anesthesia Plan  ASA: II  Anesthesia Plan: Epidural   Post-op Pain Management:    Induction:   Airway Management Planned: Natural Airway  Additional Equipment:   Intra-op Plan:   Post-operative Plan:   Informed Consent: I have reviewed the patients History and Physical, chart, labs and discussed the procedure including the risks, benefits and alternatives for the proposed anesthesia with the patient or authorized representative who has indicated his/her understanding and acceptance.   Dental advisory given  Plan Discussed with:   Anesthesia Plan Comments: (Patient identified. Risks/Benefits/Options discussed with patient including but not limited to bleeding, infection, nerve damage, paralysis, failed block, incomplete pain control, headache, blood pressure changes, nausea, vomiting, reactions to medication both or allergic, itching and postpartum back pain. Confirmed with bedside nurse the patient's most recent platelet count. Confirmed with patient that they are not currently  taking any anticoagulation, have any bleeding history or any family history of bleeding disorders. Patient expressed understanding and wished to proceed. All questions were answered. )        Anesthesia Quick Evaluation

## 2016-05-17 NOTE — Anesthesia Procedure Notes (Signed)
Epidural Patient location during procedure: OB Start time: 05/17/2016 7:15 AM End time: 05/17/2016 7:35 AM  Staffing Anesthesiologist: Jairo BenJACKSON, Stirling Orton Performed: anesthesiologist   Preanesthetic Checklist Completed: patient identified, surgical consent, pre-op evaluation, timeout performed, IV checked, risks and benefits discussed and monitors and equipment checked  Epidural Patient position: sitting Prep: ChloraPrep and site prepped and draped Patient monitoring: blood pressure, continuous pulse ox and heart rate Approach: midline Location: L2-L3 Injection technique: LOR air  Needle:  Needle type: Tuohy  Needle gauge: 17 G Needle length: 9 cm Needle insertion depth: 6 cm Catheter type: closed end flexible Catheter size: 19 Gauge Catheter at skin depth: 12 cm Test dose: negative (1% lidocaine)  Assessment Events: blood not aspirated, injection not painful, no injection resistance, negative IV test and no paresthesia  Additional Notes Pt identified in Labor room.  Monitors applied. Working IV access confirmed. Sterile prep, drape lumbar spine.  1% lido local L 2,3.  #17ga Touhy LOR air at 6 cm L 2,3, cath in easily to 12 cm skin. Test dose OK, cath dosed and infusion begun.  Patient asymptomatic, VSS, no heme aspirated, tolerated well.  Sandford Craze Jearlean Demauro, MDReason for block:procedure for pain

## 2016-05-17 NOTE — H&P (Addendum)
36 y.o. 7957w2d  G5P4004 comes in c/o SROM at 4am.  Otherwise has good fetal movement and no bleeding.  Had elevated BP in office, returned for short f/u and normal.  Past Medical History:  Diagnosis Date  . Anemia   . Headache     Past Surgical History:  Procedure Laterality Date  . HAND DEBRIDEMENT      OB History  Gravida Para Term Preterm AB Living  5 4 4  0 0 4  SAB TAB Ectopic Multiple Live Births  0 0   0 4    # Outcome Date GA Lbr Len/2nd Weight Sex Delivery Anes PTL Lv  5 Current           4 Term 10/14/10 8650w2d 16:00 / 00:08 3.805 kg (8 lb 6.2 oz)  Vag-Spont EPI  LIV     Birth Comments: none  3 Term 2007 2151w0d  2.92 kg (6 lb 7 oz) F Vag-Spont   LIV  2 Term 2003 5951w0d  3.26 kg (7 lb 3 oz) F Vag-Spont   LIV  1 Term 2002 5851w0d  3.005 kg (6 lb 10 oz) F Vag-Spont   LIV      Social History   Social History  . Marital status: Single    Spouse name: N/A  . Number of children: N/A  . Years of education: N/A   Occupational History  . Not on file.   Social History Main Topics  . Smoking status: Current Every Day Smoker    Packs/day: 0.50    Types: Cigarettes  . Smokeless tobacco: Never Used  . Alcohol use Yes     Comment: socially  . Drug use: No  . Sexual activity: Yes   Other Topics Concern  . Not on file   Social History Narrative  . No narrative on file   Shellfish allergy    Prenatal Transfer Tool  Maternal Diabetes: No Genetic Screening: Normal Maternal Ultrasounds/Referrals: Normal Fetal Ultrasounds or other Referrals:  Fetal echo  For h/o daughter with ASD, echo wnl Maternal Substance Abuse:  No Significant Maternal Medications:  None Significant Maternal Lab Results: Lab values include: Group B Strep positive  Other PNC: uncomplicated.    Vitals:   05/17/16 1331 05/17/16 1401  BP: 123/70 122/62  Pulse: 79 78  Resp: 18 18  Temp:       Lungs/Cor:  NAD Abdomen:  soft, gravid Ex:  no cords, erythema SVE:  2/80/-3 at admission FHTs:  135,  good STV, NST R Toco:  q2-6   A/P   Admitted with SROM  GBS Pos - PCN  Pitocin 2x2 after 4 hours of abx  Occ. Mild BP will monitor, AST/ALT/PLT wnl Other routine care  [place], [facility], [facility]IDNEY

## 2016-05-18 LAB — CBC
HCT: 31.1 % — ABNORMAL LOW (ref 36.0–46.0)
Hemoglobin: 10.3 g/dL — ABNORMAL LOW (ref 12.0–15.0)
MCH: 29.9 pg (ref 26.0–34.0)
MCHC: 33.1 g/dL (ref 30.0–36.0)
MCV: 90.1 fL (ref 78.0–100.0)
Platelets: 202 10*3/uL (ref 150–400)
RBC: 3.45 MIL/uL — ABNORMAL LOW (ref 3.87–5.11)
RDW: 13.8 % (ref 11.5–15.5)
WBC: 11.2 10*3/uL — AB (ref 4.0–10.5)

## 2016-05-18 LAB — RPR: RPR Ser Ql: NONREACTIVE

## 2016-05-18 NOTE — Anesthesia Postprocedure Evaluation (Addendum)
Anesthesia Post Note  Patient: Isac CaddyChristina Stokley-Johnson  Procedure(s) Performed: * No procedures listed *  Patient location during evaluation: Mother Baby Anesthesia Type: Epidural Level of consciousness: awake Pain management: satisfactory to patient Vital Signs Assessment: post-procedure vital signs reviewed and stable Respiratory status: spontaneous breathing Cardiovascular status: stable Anesthetic complications: no        Last Vitals:  Vitals:   05/18/16 0500 05/18/16 0730  BP: (!) 141/74 123/72  Pulse: 69 64  Resp: 18 16  Temp: 36.7 C 36.7 C    Last Pain:  Vitals:   05/18/16 0730  TempSrc: Oral  PainSc:    Pain Goal: Patients Stated Pain Goal: 3 (05/17/16 0708)               Cephus ShellingBURGER,LINDA

## 2016-05-18 NOTE — Progress Notes (Signed)
Post Partum Day 1 Subjective: no complaints, up ad lib, voiding, tolerating PO and + flatus  Objective: Blood pressure 123/72, pulse 64, temperature 98 F (36.7 C), temperature source Oral, resp. rate 16, height 5\' 6"  (1.676 m), weight 94.3 kg (208 lb), last menstrual period 08/17/2015, SpO2 98 %, unknown if currently breastfeeding.  Physical Exam:  General: alert, cooperative and no distress Lochia: appropriate Uterine Fundus: firm perineum: healing well, no significant drainage DVT Evaluation: No evidence of DVT seen on physical exam. Negative Homan's sign. No cords or calf tenderness.   Recent Labs  05/17/16 0610 05/18/16 0509  HGB 10.9* 10.3*  HCT 32.3* 31.1*    Assessment/Plan: Plan for discharge tomorrow and Circumcision in office  LOS: 1 day   Theresa Banks [name] 05/18/2016, 5:56 PM

## 2016-05-19 MED ORDER — FAMOTIDINE 20 MG PO TABS
20.0000 mg | ORAL_TABLET | Freq: Two times a day (BID) | ORAL | Status: DC
  Administered 2016-05-19: 20 mg via ORAL
  Filled 2016-05-19: qty 1

## 2016-05-19 MED ORDER — ONDANSETRON HCL 4 MG PO TABS: 4.0000 mg | ORAL_TABLET | ORAL | 0 refills | Status: AC | PRN

## 2016-05-19 MED ORDER — IBUPROFEN 600 MG PO TABS: 600.0000 mg | ORAL_TABLET | Freq: Four times a day (QID) | ORAL | 0 refills | Status: DC

## 2016-05-19 NOTE — Plan of Care (Signed)
Problem: Education: Goal: Knowledge of condition will improve Discharge education reviewed with patient. Patient verbalizes understanding.    

## 2016-05-19 NOTE — Progress Notes (Signed)
Patient is doing well.  She is ambulating, voiding, tolerating PO.  Pain control is good.  Lochia is appropriate Mild nausea this AM, no vomiting.  Vitals:   05/18/16 0500 05/18/16 0730 05/18/16 1814 05/19/16 0608  BP: (!) 141/74 123/72 (!) 147/69 126/61  Pulse: 69 64 (!) 57 66  Resp: 18 16 18 19   Temp: 98.1 F (36.7 C) 98 F (36.7 C) 98.1 F (36.7 C) 97.8 F (36.6 C)  TempSrc: Oral Oral Oral Oral  SpO2:  98%    Weight:      Height:        NAD Fundus firm Ext: no edema  Lab Results  Component Value Date   WBC 11.2 (H) 05/18/2016   HGB 10.3 (L) 05/18/2016   HCT 31.1 (L) 05/18/2016   MCV 90.1 05/18/2016   PLT 202 05/18/2016    --/--/O POS (05/08 0610)/RImmune  A/P 36 y.o. [id number] PPD#2. Routine care.   Expect d/c today.    The BridgewayDYANNA GEFFEL The Timken CompanyCLARK

## 2016-05-23 ENCOUNTER — Inpatient Hospital Stay (HOSPITAL_COMMUNITY): Admission: RE | Admit: 2016-05-23 | Payer: No Typology Code available for payment source | Source: Ambulatory Visit

## 2016-05-23 NOTE — Discharge Summary (Signed)
Obstetric Discharge Summary Reason for Admission: rupture of membranes Prenatal Procedures: none Intrapartum Procedures: spontaneous vaginal delivery Postpartum Procedures: none Complications-Operative and Postpartum: none Hemoglobin  Date Value Ref Range Status  05/18/2016 10.3 (L) 12.0 - 15.0 g/dL Final   HCT  Date Value Ref Range Status  05/18/2016 31.1 (L) 36.0 - 46.0 % Final    Physical Exam:  General: alert, cooperative and appears stated age 75Lochia: appropriate Uterine Fundus: firm  DVT Evaluation: No evidence of DVT seen on physical exam.  Discharge Diagnoses: Term Pregnancy-delivered  Discharge Information: Date: 05/23/2016 Activity: pelvic rest Diet: routine Medications: PNV, Ibuprofen and zofran Condition: stable Instructions: refer to practice specific booklet Discharge to: home   Newborn Data: Live born female  Birth Weight: 7 lb 3 oz (3260 g) APGAR: 9, 9  Home with mother.  Theresa Banks 05/23/2016, 7:34 AM

## 2016-07-28 NOTE — Addendum Note (Signed)
Addendum  created 07/28/16 1815 by Jairo BenJackson, Konni Kesinger, MD   Sign clinical note

## 2017-01-29 ENCOUNTER — Other Ambulatory Visit: Payer: Self-pay

## 2017-01-29 ENCOUNTER — Emergency Department (HOSPITAL_COMMUNITY)
Admission: EM | Admit: 2017-01-29 | Discharge: 2017-01-29 | Disposition: A | Discharge status: Home / Self Care | Payer: Self-pay | Attending: Emergency Medicine | Admitting: Emergency Medicine

## 2017-01-29 ENCOUNTER — Encounter (HOSPITAL_COMMUNITY): Payer: Self-pay | Admitting: Emergency Medicine

## 2017-01-29 DIAGNOSIS — K029 Dental caries, unspecified: Secondary | ICD-10-CM | POA: Insufficient documentation

## 2017-01-29 DIAGNOSIS — F17210 Nicotine dependence, cigarettes, uncomplicated: Secondary | ICD-10-CM | POA: Insufficient documentation

## 2017-01-29 DIAGNOSIS — K0889 Other specified disorders of teeth and supporting structures: Secondary | ICD-10-CM | POA: Insufficient documentation

## 2017-01-29 DIAGNOSIS — Z79899 Other long term (current) drug therapy: Secondary | ICD-10-CM | POA: Insufficient documentation

## 2017-01-29 MED ORDER — BUPIVACAINE-EPINEPHRINE (PF) 0.5% -1:200000 IJ SOLN
1.8000 mL | Freq: Once | INTRAMUSCULAR | Status: AC
  Administered 2017-01-29: 1.8 mL
  Filled 2017-01-29: qty 1.8

## 2017-01-29 MED ORDER — NAPROXEN 500 MG PO TABS: 500.0000 mg | ORAL_TABLET | Freq: Two times a day (BID) | ORAL | 0 refills | Status: DC

## 2017-01-29 MED ORDER — PENICILLIN V POTASSIUM 500 MG PO TABS: 500.0000 mg | ORAL_TABLET | Freq: Four times a day (QID) | ORAL | 0 refills | Status: AC

## 2017-01-29 MED ORDER — LIDOCAINE VISCOUS 2 % MT SOLN: 15.0000 mL | OROMUCOSAL | 2 refills | Status: AC | PRN

## 2017-01-29 NOTE — ED Provider Notes (Signed)
[facility] EMERGENCY DEPARTMENT Provider Note   CSN: [id number] Arrival date & time: 01/29/17  1921     History   Chief Complaint Chief Complaint  Patient presents with  . Dental Pain    HPI Theresa Banks is a 37 y.o. female.  HPI   Theresa Banks is a 37 y.o. female, with a history of anemia, presenting to the ED with right upper dental pain intermittent for the last 2 months, but constant beginning yesterday.  Pain is aching, 10/10, radiating to the right side of the face.  She has tried [employer], Goody powder, ibuprofen, and Tylenol without complete relief.  She denies fever/chills, nausea/vomiting, difficulty breathing or swallowing, facial swelling, or any other complaints.  Patient states she is not breast-feeding.  Past Medical History:  Diagnosis Date  . Anemia   . Headache     Patient Active Problem List   Diagnosis Date Noted  . Indication for care in labor and delivery, antepartum 05/17/2016  . Encounter for supervision of other normal pregnancy, third trimester 05/17/2016    Past Surgical History:  Procedure Laterality Date  . HAND DEBRIDEMENT      OB History    Gravida Para Term Preterm AB Living   5 5 5  0 0 5   SAB TAB Ectopic Multiple Live Births   0 0   0 5       Home Medications    Prior to Admission medications   Medication Sig Start Date End Date Taking? Authorizing Provider  EPINEPHrine (EPIPEN 2-PAK) 0.3 mg/0.3 mL IJ SOAJ injection Inject 0.3 mLs (0.3 mg total) into the muscle once. Then go to nearest ER 02/28/14   Marisa Severin, MD  ibuprofen (ADVIL,MOTRIN) 600 MG tablet Take 1 tablet (600 mg total) by mouth every 6 (six) hours. 05/19/16   Marlow Baars, MD  ondansetron (ZOFRAN) 4 MG tablet Take 1 tablet (4 mg total) by mouth every 4 (four) hours as needed for nausea. 05/19/16   Marlow Baars, MD  Prenatal Vit-Fe Fumarate-FA (PRENATAL MULTIVITAMIN) TABS tablet Take 1 tablet by mouth daily at 12  noon.    [provider]    Family History Family History  Problem Relation Age of Onset  . Birth defects Daughter        ASD reapired at 54 YO    Social History Social History   Tobacco Use  . Smoking status: Current Every Day Smoker    Packs/day: 0.50    Types: Cigarettes  . Smokeless tobacco: Never Used  Substance Use Topics  . Alcohol use: Yes    Comment: socially  . Drug use: No     Allergies   Shellfish allergy   Review of Systems Review of Systems  Constitutional: Negative for chills, diaphoresis and fever.  HENT: Positive for dental problem. Negative for drooling and trouble swallowing.   Respiratory: Negative for shortness of breath.   Gastrointestinal: Negative for nausea and vomiting.  Musculoskeletal: Negative for neck pain.     Physical Exam Updated Vital Signs BP (!) 170/96 (BP Location: Right Arm)   Pulse 78   Temp 98.8 F (37.1 C) (Oral)   Resp 16   Ht 5\' 6"  (1.676 m)   Wt 90.7 kg (200 lb)   LMP 12/23/2016   SpO2 100%   BMI 32.28 kg/m   Physical Exam  Constitutional: She appears well-developed and well-nourished. No distress.  HENT:  Head: Normocephalic and atraumatic.  Significant erosion to the right maxillary rearmost  molar.  Tenderness to the tooth, but no noted surrounding tenderness, swelling, or fluctuance.  Patient handles oral secretions without difficulty.  Mouth opening to at least 3 finger widths.  No submandibular or submental swelling.  No facial swelling noted.  Eyes: Conjunctivae are normal.  Neck: Normal range of motion. Neck supple.  Cardiovascular: Normal rate and regular rhythm.  Pulmonary/Chest: Effort normal.  Lymphadenopathy:    She has no cervical adenopathy.  Neurological: She is alert.  Skin: Skin is warm and dry. She is not diaphoretic. No pallor.  Psychiatric: She has a normal mood and affect. Her behavior is normal.  Nursing note and vitals reviewed.    ED Treatments / Results  Labs (all labs  ordered are listed, but only abnormal results are displayed) Labs Reviewed - No data to display  EKG  EKG Interpretation None       Radiology No results found.  Procedures Dental Block Date/Time: 01/29/2017 8:20 PM Performed by: Anselm PancoastJoy, Shawn C, PA-C Authorized by: Anselm PancoastJoy, Shawn C, PA-C   Consent:    Consent obtained:  Verbal   Consent given by:  Patient   Risks discussed:  Swelling, unsuccessful block, pain, nerve damage, infection and hematoma Indications:    Indications: dental pain   Location:    Block type:  Posterior superior alveolar   Laterality:  Right Procedure details (see MAR for exact dosages):    Syringe type:  Controlled syringe   Needle gauge:  27 G   Anesthetic injected:  Bupivacaine 0.5% WITH epi   Injection procedure:  Anatomic landmarks identified, anatomic landmarks palpated, introduced needle, negative aspiration for blood and incremental injection Post-procedure details:    Outcome:  Pain relieved   Patient tolerance of procedure:  Tolerated well, no immediate complications    (including critical care time)  Medications Ordered in ED Medications  bupivacaine-epinephrine (MARCAINE W/ EPI) 0.5% -1:200000 injection 1.8 mL (not administered)     Initial Impression / Assessment and Plan / ED Course  I have reviewed the triage vital signs and the nursing notes.  Pertinent labs & imaging results that were available during my care of the patient were reviewed by me and considered in my medical decision making (see chart for details).      Patient presents with dental pain.  Low suspicion for sepsis or Ludwig's angioedema.  Dental block performed without immediate complication.  Recommend dentist follow-up.  Resources given. The patient was given instructions for home care as well as return precautions. Patient voices understanding of these instructions, accepts the plan, and is comfortable with discharge.    Final Clinical Impressions(s) / ED  Diagnoses   Final diagnoses:  Pain, dental  Dental caries    ED Discharge Orders    None       Anselm PancoastJoy, Shawn C, PA-C 01/29/17 2030    Concepcion LivingJoy, Shawn C, PA-C 01/29/17 2033    Rolland PorterJames, Mark, MD 01/29/17 2148

## 2017-01-29 NOTE — ED Triage Notes (Signed)
Pt c/o right dental pain x "a couple months". Usually able to control pain with ibuprofen. Mild swelling noted. Pt tearful in triage.

## 2017-01-29 NOTE — Discharge Instructions (Addendum)
You have been seen today for dental pain. You should follow up with a dentist as soon as possible. This problem will not resolve on its own without the care of a dentist. Use ibuprofen or naproxen for pain. Use the viscous lidocaine for mouth pain. Swish with the lidocaine and spit it out. Do not swallow it. ° °Antiinflammatory medications: Take 600 mg of ibuprofen every 6 hours or 440 mg (over the counter dose) to 500 mg (prescription dose) of naproxen every 12 hours for the next 3 days. After this time, these medications may be used as needed for pain. Take these medications with food to avoid upset stomach. Choose only one of these medications, do not take them together. °Tylenol: Should you continue to have additional pain while taking the ibuprofen or naproxen, you may add in tylenol as needed. Your daily total maximum amount of tylenol from all sources should be limited to 4000mg/day for persons without liver problems, or 2000mg/day for those with liver problems. ° °Please take all of your antibiotics until finished!   You may develop abdominal discomfort or diarrhea from the antibiotic.  You may help offset this with probiotics which you can buy or get in yogurt. Do not eat or take the probiotics until 2 hours after your antibiotic.  ° °Dental Resource Guide ° °[facility] °[address], [address] °[place], [place] [address] °[phone] ° °[facility] °[address] °[place], [place] [address] °[phone] ° °[facility] °[address] °[place], [place] [address] °[phone]. [phone] ° °[facility] °[address] °[place], [place] [address] °[phone] ° °[facility] °[address] °[place], [place] [address] °[phone] ° °[employer] °Www.[facility].edu/patientcare/studentclinics/becomepatient ° °[employer] [facility] [employer] [facility] °[address] °[place], [place] [address] °[phone] ° °Website for free, low-income, or sliding scale dental services in [place]: °www.freedental.us ° °To find a dentist in [place] and surrounding areas: °www.ncdental.org/for-the-public/find-a-dentist ° °[facility] °http://www.ncdental.org/meetings-events/[place] ° °[place] [facility] °https://dma.ncdhhs.gov/find-a-doctor/medicaid-dental-providers ° °

## 2017-04-15 ENCOUNTER — Other Ambulatory Visit: Payer: Self-pay

## 2017-04-15 ENCOUNTER — Emergency Department (HOSPITAL_COMMUNITY)
Admission: EM | Admit: 2017-04-15 | Discharge: 2017-04-15 | Disposition: A | Discharge status: Home / Self Care | Payer: Self-pay | Attending: Emergency Medicine | Admitting: Emergency Medicine

## 2017-04-15 ENCOUNTER — Encounter (HOSPITAL_COMMUNITY): Payer: Self-pay | Admitting: Emergency Medicine

## 2017-04-15 DIAGNOSIS — F17210 Nicotine dependence, cigarettes, uncomplicated: Secondary | ICD-10-CM | POA: Insufficient documentation

## 2017-04-15 DIAGNOSIS — J029 Acute pharyngitis, unspecified: Secondary | ICD-10-CM | POA: Insufficient documentation

## 2017-04-15 LAB — RAPID STREP SCREEN (MED CTR MEBANE ONLY): Streptococcus, Group A Screen (Direct): NEGATIVE

## 2017-04-15 MED ORDER — DEXAMETHASONE 4 MG PO TABS
12.0000 mg | ORAL_TABLET | Freq: Once | ORAL | Status: AC
  Administered 2017-04-15: 12 mg via ORAL
  Filled 2017-04-15: qty 3

## 2017-04-15 MED ORDER — IBUPROFEN 400 MG PO TABS
600.0000 mg | ORAL_TABLET | Freq: Once | ORAL | Status: AC
  Administered 2017-04-15: 09:00:00 600 mg via ORAL
  Filled 2017-04-15: qty 1

## 2017-04-15 MED ORDER — LIDOCAINE VISCOUS 2 % MT SOLN
15.0000 mL | Freq: Once | OROMUCOSAL | Status: AC
  Administered 2017-04-15: 15 mL via OROMUCOSAL
  Filled 2017-04-15: qty 15

## 2017-04-15 NOTE — ED Triage Notes (Signed)
Patient is from home, states that since Monday she has been having trouble swallowing on the right side of her throat.  She states that she has been forcing herself to eat, but it feels like she has pins in her throat and she is unable to stick her tongue out without it having pain.

## 2017-04-15 NOTE — ED Notes (Signed)
See EDP secondary assessment.  

## 2017-04-15 NOTE — ED Notes (Signed)
Dr. Kohut at bedside at this time.  

## 2017-04-15 NOTE — Discharge Instructions (Addendum)
Rapid strep test was negative.  This is likely a viral pharyngitis.  Continue with the salt water rinses.  Take ibuprofen 600 mg every 6 hours as needed for pain.

## 2017-04-15 NOTE — ED Provider Notes (Signed)
[facility] EMERGENCY DEPARTMENT Provider Note   CSN: [id number] Arrival date & time: 04/15/17  0536     History   Chief Complaint Chief Complaint  Patient presents with  . Dysphagia    HPI Theresa Banks is a 37 y.o. female.  HPI   37 year old female with sore throat.  Symptom onset Sunday.  She initially has some mild discomfort in the evening.  Pain has progressively worsened.  Pain is worse on the right side.  She is some radiation towards her right ear.  Decreased p.o. intake secondary to the pain.  No fevers or chills.  No cough. She has tried gargling salt water and hydrogen peroxide.   Past Medical History:  Diagnosis Date  . Anemia   . Headache     Patient Active Problem List   Diagnosis Date Noted  . Indication for care in labor and delivery, antepartum 05/17/2016  . Encounter for supervision of other normal pregnancy, third trimester 05/17/2016    Past Surgical History:  Procedure Laterality Date  . HAND DEBRIDEMENT       OB History    Gravida  5   Para  5   Term  5   Preterm  0   AB  0   Living  5     SAB  0   TAB  0   Ectopic      Multiple  0   Live Births  5            Home Medications    Prior to Admission medications   Medication Sig Start Date End Date Taking? Authorizing Provider  EPINEPHrine (EPIPEN 2-PAK) 0.3 mg/0.3 mL IJ SOAJ injection Inject 0.3 mLs (0.3 mg total) into the muscle once. Then go to nearest ER 02/28/14   Marisa Severin, MD  ibuprofen (ADVIL,MOTRIN) 600 MG tablet Take 1 tablet (600 mg total) by mouth every 6 (six) hours. 05/19/16   Marlow Baars, MD  lidocaine (XYLOCAINE) 2 % solution Use as directed 15 mLs in the mouth or throat as needed for mouth pain. 01/29/17   Joy, Shawn C, PA-C  naproxen (NAPROSYN) 500 MG tablet Take 1 tablet (500 mg total) by mouth 2 (two) times daily. 01/29/17   Joy, Shawn C, PA-C  ondansetron (ZOFRAN) 4 MG tablet Take 1 tablet (4 mg total) by mouth every 4  (four) hours as needed for nausea. 05/19/16   Marlow Baars, MD  Prenatal Vit-Fe Fumarate-FA (PRENATAL MULTIVITAMIN) TABS tablet Take 1 tablet by mouth daily at 12 noon.    [provider]    Family History Family History  Problem Relation Age of Onset  . Birth defects Daughter        ASD reapired at 16 YO    Social History Social History   Tobacco Use  . Smoking status: Current Every Day Smoker    Packs/day: 0.50    Types: Cigarettes  . Smokeless tobacco: Never Used  Substance Use Topics  . Alcohol use: Yes    Comment: socially  . Drug use: No     Allergies   Shellfish allergy   Review of Systems Review of Systems  All systems reviewed and negative, other than as noted in HPI.  Physical Exam Updated Vital Signs BP 121/81 (BP Location: Right Arm)   Pulse 86   Temp 98.4 F (36.9 C) (Oral)   Resp 17   Ht 5\' 7"  (1.702 m)   Wt 89.8 kg (198 lb)   SpO2  100%   BMI 31.01 kg/m   Physical Exam  Constitutional: She appears well-developed and well-nourished. No distress.  HENT:  Head: Normocephalic and atraumatic.  Exudative pharyngitis.  Uvula is midline.  Normal sounding voice.  Handling secretions.  Neck is supple.  Submental tissues are soft.  Right external auditory canal and TM normal in appearance.  Eyes: Conjunctivae are normal. Right eye exhibits no discharge. Left eye exhibits no discharge.  Neck: Neck supple.  Cardiovascular: Normal rate, regular rhythm and normal heart sounds. Exam reveals no gallop and no friction rub.  No murmur heard. Pulmonary/Chest: Effort normal and breath sounds normal. No respiratory distress.  Abdominal: Soft. She exhibits no distension. There is no tenderness.  Musculoskeletal: She exhibits no edema or tenderness.  Neurological: She is alert.  Skin: Skin is warm and dry.  Psychiatric: She has a normal mood and affect. Her behavior is normal. Thought content normal.  Nursing note and vitals reviewed.    ED Treatments  / Results  Labs (all labs ordered are listed, but only abnormal results are displayed) Labs Reviewed  RAPID STREP SCREEN (NOT AT [facility])  CULTURE, GROUP A STREP [facility])    EKG None  Radiology No results found.  Procedures Procedures (including critical care time)  Medications Ordered in ED Medications  lidocaine (XYLOCAINE) 2 % viscous mouth solution 15 mL (has no administration in time range)  dexamethasone (DECADRON) tablet 12 mg (has no administration in time range)  ibuprofen (ADVIL,MOTRIN) tablet 600 mg (has no administration in time range)     Initial Impression / Assessment and Plan / ED Course  I have reviewed the triage vital signs and the nursing notes.  Pertinent labs & imaging results that were available during my care of the patient were reviewed by me and considered in my medical decision making (see chart for details).     37 year old female with exudative pharyngitis.  She is afebrile.  Nontoxic.  No particularly concerning features in terms of serious deep space infection.  Rapid strep was negative.  Treat symptomatically.  She was given a dose of Decadron in the ED.  Continued NSAIDs as needed.  Final Clinical Impressions(s) / ED Diagnoses   Final diagnoses:  Pharyngitis, unspecified etiology    ED Discharge Orders    None       Raeford RazorKohut, Lillyen Schow, MD 04/15/17 [id number]

## 2017-04-17 LAB — CULTURE, GROUP A STREP (THRC)

## 2020-05-19 DIAGNOSIS — Z23 Encounter for immunization: Secondary | ICD-10-CM | POA: Diagnosis not present

## 2020-12-15 ENCOUNTER — Ambulatory Visit: Payer: Self-pay | Admitting: Family Medicine

## 2021-09-08 ENCOUNTER — Ambulatory Visit (INDEPENDENT_AMBULATORY_CARE_PROVIDER_SITE_OTHER): Payer: Medicaid Other | Admitting: Orthopaedic Surgery

## 2021-09-08 ENCOUNTER — Ambulatory Visit (INDEPENDENT_AMBULATORY_CARE_PROVIDER_SITE_OTHER): Payer: Medicaid Other

## 2021-09-08 ENCOUNTER — Encounter: Payer: Self-pay | Admitting: Orthopaedic Surgery

## 2021-09-08 DIAGNOSIS — S43004A Unspecified dislocation of right shoulder joint, initial encounter: Secondary | ICD-10-CM | POA: Diagnosis not present

## 2021-09-08 DIAGNOSIS — M25511 Pain in right shoulder: Secondary | ICD-10-CM | POA: Diagnosis not present
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
## 2021-09-15 ENCOUNTER — Ambulatory Visit: Payer: Self-pay | Admitting: Orthopaedic Surgery

## 2021-09-29 ENCOUNTER — Ambulatory Visit (INDEPENDENT_AMBULATORY_CARE_PROVIDER_SITE_OTHER): Payer: Medicaid Other | Admitting: Orthopaedic Surgery

## 2021-09-29 ENCOUNTER — Encounter: Payer: Self-pay | Admitting: Orthopaedic Surgery

## 2021-09-29 DIAGNOSIS — M25511 Pain in right shoulder: Secondary | ICD-10-CM

## 2021-09-29 DIAGNOSIS — S43004A Unspecified dislocation of right shoulder joint, initial encounter: Secondary | ICD-10-CM

## 2021-09-29 NOTE — Progress Notes (Signed)
Office Visit Note   Patient: Theresa Banks           Date of Birth: 1980/01/22           MRN: [id number] Visit Date: 09/29/2021              Requested by: No referring provider defined for this encounter. PCP: Patient, No Pcp Per  Chief Complaint  Patient presents with   Right Shoulder - Pain, Follow-up      HPI: Theresa Banks is here for follow-up on her right shoulder.  She is 1 month status post right shoulder dislocation which happened when she was in [place].  She did have to be taken to the operating room to have this closed reduction.  She has not had previous history of dislocation.  She has been in a sling and is here for follow-up.  She does feel like her shoulder is locking up at night.  Relates that it "feels like it needs to be popped ".  She still has some numbness in her forearm.  She can lift her arm if she is laying on her back but not when she is sitting.  She has been taking Tylenol as needed.  Assessment & Plan: Visit Diagnoses:  1. Shoulder dislocation, right, initial encounter   2. Acute pain of right shoulder     Plan: Patient is now 1 month status postinitial right shoulder dislocation after a fall.  Would recommend an MRI however she is in the process of getting insurance.  She may do well with some physical therapy while we are waiting on this.  We will refer her to physical therapy for modalities range of motion.  If she does not get significantly better would recommend an MRI arthrogram.  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   Ortho Exam  Patient is alert, oriented, no adenopathy, well-dressed, normal affect, normal respiratory effort. Right shoulder immobilized in a sling.  Distal sensation is intact.  She has a strong radial pulse.  Previous skin abrasion now healed open areas she does have  passive range of motion in 90 degrees of abduction and 90 degrees of flexion.  Biceps are intact no popping or clicking she has difficulty with active range of motion  Imaging: No results found. No images are attached to the encounter.  Labs: Lab Results  Component Value Date   ESRSEDRATE 3 06/29/2013   REPTSTATUS 04/17/2017 FINAL 04/15/2017   CULT  04/15/2017    NO GROUP A STREP (S.PYOGENES) ISOLATED Performed at [facility], [address], [place], [place] [address]      Lab Results  Component Value Date   ALBUMIN 3.2 (L) 05/17/2016   ALBUMIN 3.0 (L) 05/09/2016   ALBUMIN 4.4 09/14/2015    No results found for: "MG" No results found for: "VD25OH"  No results found for: "PREALBUMIN"    Latest Ref Rng & Units 05/18/2016    5:09 AM 05/17/2016    6:10 AM 05/09/2016   12:39 PM  CBC EXTENDED  WBC 4.0 - 10.5 K/uL 11.2  8.0  7.2   RBC 3.87 - 5.11 MIL/uL 3.45  3.63  3.32   Hemoglobin 12.0 - 15.0 g/dL 29.9  37.1  69.6   HCT 36.0 - 46.0 % 31.1  32.3  29.9   Platelets 150 - 400 K/uL  202  207  191      There is no height or weight on file to calculate BMI.  Orders:  Orders Placed This Encounter  Procedures   Ambulatory referral to Physical Therapy   No orders of the defined types were placed in this encounter.    Procedures: No procedures performed  Clinical Data: No additional findings.  ROS:  All other systems negative, except as noted in the HPI. Review of Systems  Objective: Vital Signs: There were no vitals taken for this visit.  Specialty Comments:  No specialty comments available.  PMFS History: Patient Active Problem List   Diagnosis Date Noted   Shoulder dislocation, right, initial encounter 09/08/2021   Indication for care in labor and delivery, antepartum 05/17/2016   Encounter for supervision of other normal pregnancy, third trimester 05/17/2016   Past Medical History:  Diagnosis Date   Anemia    Headache     Family History   Problem Relation Age of Onset   Birth defects Daughter        ASD reapired at 52 YO    Past Surgical History:  Procedure Laterality Date   HAND DEBRIDEMENT     Social History   Occupational History   Not on file  Tobacco Use   Smoking status: Every Day    Packs/day: 0.50    Types: Cigarettes   Smokeless tobacco: Never  Substance and Sexual Activity   Alcohol use: Yes    Comment: socially   Drug use: No   Sexual activity: Yes

## 2021-09-30 ENCOUNTER — Ambulatory Visit: Payer: Medicaid Other | Admitting: Physical Therapy
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
## 2021-10-01 ENCOUNTER — Ambulatory Visit: Payer: Medicaid Other | Attending: Orthopaedic Surgery

## 2021-10-01 DIAGNOSIS — M25511 Pain in right shoulder: Secondary | ICD-10-CM | POA: Insufficient documentation

## 2021-10-01 DIAGNOSIS — M25619 Stiffness of unspecified shoulder, not elsewhere classified: Secondary | ICD-10-CM | POA: Insufficient documentation

## 2021-10-01 DIAGNOSIS — S43004A Unspecified dislocation of right shoulder joint, initial encounter: Secondary | ICD-10-CM | POA: Insufficient documentation

## 2021-10-01 DIAGNOSIS — S43014A Anterior dislocation of right humerus, initial encounter: Secondary | ICD-10-CM | POA: Insufficient documentation

## 2021-10-11 ENCOUNTER — Ambulatory Visit: Payer: BLUE CROSS/BLUE SHIELD | Attending: Orthopaedic Surgery

## 2021-10-11 ENCOUNTER — Telehealth: Payer: Self-pay

## 2021-10-11 DIAGNOSIS — S43014A Anterior dislocation of right humerus, initial encounter: Secondary | ICD-10-CM | POA: Insufficient documentation

## 2021-10-11 DIAGNOSIS — M25619 Stiffness of unspecified shoulder, not elsewhere classified: Secondary | ICD-10-CM | POA: Insufficient documentation

## 2021-10-11 DIAGNOSIS — M25511 Pain in right shoulder: Secondary | ICD-10-CM | POA: Insufficient documentation

## 2021-10-11 NOTE — Therapy (Deleted)
OUTPATIENT PHYSICAL THERAPY TREATMENT NOTE   Patient Name: Theresa Banks MRN: [id number] DOB:[birth year], 41 y.o., female Today's Date: 10/11/2021  PCP: Patient, No Pcp Per REFERRING PROVIDER: Garald Balding, MD  END OF SESSION:    Past Medical History:  Diagnosis Date   Anemia    Headache    Past Surgical History:  Procedure Laterality Date   HAND DEBRIDEMENT     Patient Active Problem List   Diagnosis Date Noted   Shoulder dislocation, right, initial encounter 09/08/2021   Indication for care in labor and delivery, antepartum 05/17/2016   Encounter for supervision of other normal pregnancy, third trimester 05/17/2016    REFERRING DIAG: S43.004A (ICD-10-CM) - Shoulder dislocation, right, initial encounter M25.511 (ICD-10-CM) - Acute pain of right shoulder   THERAPY DIAG: Right shoulder pain with hx of shoulder dislocation   Rationale for Evaluation and Treatment Rehabilitation  PERTINENT HISTORY: [name] is here for follow-up on her right shoulder.  She is 1 month status post right shoulder dislocation which happened when she was in [place].  She did have to be taken to the operating room to have this closed reduction.  She has not had previous history of dislocation.  She has been in a sling and is here for follow-up.  She does feel like her shoulder is locking up at night.  Relates that it "feels like it needs to be popped ".  She still has some numbness in her forearm.  She can lift her arm if she is laying on her back but not when she is sitting.  She has been taking Tylenol as needed.   PRECAUTIONS: Shoulder dislocation  SUBJECTIVE: ***  PAIN:  Are you having pain? {OPRCPAIN:[id number]}   OBJECTIVE: (objective measures completed at initial evaluation unless otherwise dated)  DIAGNOSTIC FINDINGS:  none   PATIENT SURVEYS:  Quick Dash 31/55   COGNITION:           Overall cognitive status: Within functional limits for tasks assessed                                   SENSATION: Not tested   POSTURE: Elevated R shoulder posture    UPPER EXTREMITY ROM:    A/PROM Right eval Left eval  Shoulder flexion 30/120d    Shoulder extension WNL    Shoulder abduction 30/80d    Shoulder adduction      Shoulder internal rotation 70/70d    Shoulder external rotation 40/40d    Elbow flexion WNL    Elbow extension WNL    Wrist flexion      Wrist extension      Wrist ulnar deviation      Wrist radial deviation      Wrist pronation      Wrist supination      (Blank rows = not tested)   UPPER EXTREMITY MMT:   MMT Right eval Left eval  Shoulder flexion 2    Shoulder extension 3    Shoulder abduction 2    Shoulder adduction      Shoulder internal rotation 4    Shoulder external rotation 3    Middle trapezius      Lower trapezius      Elbow flexion      Elbow extension      Wrist flexion      Wrist extension      Wrist ulnar deviation  Wrist radial deviation      Wrist pronation      Wrist supination      Grip strength (lbs)      (Blank rows = not tested)   SHOULDER SPECIAL TESTS:            Open can, drop arm positive R   JOINT MOBILITY TESTING:  deferred   PALPATION:  Atrophy of R supraspinatus muscle             TODAY'S TREATMENT:  Eval     PATIENT EDUCATION: Education details: Discussed eval findings, rehab rationale and POC and patient is in agreement  Person educated: Patient Education method: Explanation Education comprehension: verbalized understanding and needs further education     HOME EXERCISE PROGRAM: Access Code: [email or web address] URL: [email or web address] Date: 10/01/2021 Prepared by: Gustavus Bryant   Exercises - Standing Shoulder Abduction ROM with Dowel  - 2 x daily - 7 x weekly - 2 sets - 10 reps - Supine Shoulder Flexion with Dowel  - 2 x daily - 7 x weekly - 2 sets - 10 reps   ASSESSMENT:   CLINICAL IMPRESSION: Patient is a 41 y.o. female who was seen today for  physical therapy evaluation and treatment for R shoulder pain, weakness and stiffness following traumatic dislocation on 8/19//23.  Patient unable to flex or abduct R arm due to isolated weakness,  positive drop arm and sulcus sign on R shoulder.  Atrophy palpated at supraspinatus fossa.     OBJECTIVE IMPAIRMENTS decreased knowledge of condition, decreased mobility, decreased ROM, decreased strength, hypomobility, impaired UE functional use, and pain.    ACTIVITY LIMITATIONS carrying, lifting, bed mobility, dressing, and reach over head   PERSONAL FACTORS Past/current experiences are also affecting patient's functional outcome.    REHAB POTENTIAL: Fair based on suspected RC tear   CLINICAL DECISION MAKING: Evolving/moderate complexity   EVALUATION COMPLEXITY: Moderate     GOALS: Goals reviewed with patient? No   SHORT TERM GOALS=LONG TERM OALS: Target date: 10/29/2021     Patient to demonstrate independence in HEP Baseline:[id number] Goal status: INITIAL   2.  Increase AROM R shoulder to 160d flexion and abduction Baseline:  A/PROM Right eval Left eval  Shoulder flexion 30/120d    Shoulder extension WNL    Shoulder abduction 30/80d    Shoulder adduction      Shoulder internal rotation 70/70d    Shoulder external rotation 40/40d    Elbow flexion WNL    Elbow extension WNL      Goal status: INITIAL   3.  Decrease pain to 3/10 Baseline: 5/10 Goal status: INITIAL   4.  Decrease DASH score to 23/55 Baseline: 31/55 Goal status: INITIAL         PLAN: PT FREQUENCY: 1x/week   PT DURATION: 4 weeks   PLANNED INTERVENTIONS: Therapeutic exercises, Therapeutic activity, Neuromuscular re-education, Balance training, Gait training, Patient/Family education, Self Care, Joint mobilization, and Manual therapy   PLAN FOR NEXT SESSION: ROM, aerobic conditioning, R shoulder/RC strengthening as tolerated   Hildred Laser, PT 10/11/2021, 7:46 AM

## 2021-10-11 NOTE — Telephone Encounter (Signed)
TC due to missed visit, spoke directly to patient, she attempted to call this AM to [place] [facility] to call her back but has not yet received a return call.  Patient will reschedule today's visit.

## 2021-10-18 ENCOUNTER — Ambulatory Visit: Payer: BLUE CROSS/BLUE SHIELD

## 2021-10-18 DIAGNOSIS — S43014A Anterior dislocation of right humerus, initial encounter: Secondary | ICD-10-CM | POA: Diagnosis present

## 2021-10-18 DIAGNOSIS — M25619 Stiffness of unspecified shoulder, not elsewhere classified: Secondary | ICD-10-CM | POA: Diagnosis present

## 2021-10-18 DIAGNOSIS — M25511 Pain in right shoulder: Secondary | ICD-10-CM

## 2021-10-18 NOTE — Therapy (Signed)
OUTPATIENT PHYSICAL THERAPY TREATMENT NOTE   Patient Name: Theresa Banks MRN: [id number] DOB:11-04-1980, 41 y.o., female Today's Date: 10/18/2021  PCP: Patient, No Pcp Per REFERRING PROVIDER: Garald Balding, MD  END OF SESSION:   PT End of Session - 10/18/21 1053     Visit Number 2    Number of Visits 4    Authorization Type MCD    PT Start Time 1050    PT Stop Time 1130    PT Time Calculation (min) 40 min    Activity Tolerance Patient tolerated treatment well    Behavior During Therapy WFL for tasks assessed/performed             Past Medical History:  Diagnosis Date   Anemia    Headache    Past Surgical History:  Procedure Laterality Date   HAND DEBRIDEMENT     Patient Active Problem List   Diagnosis Date Noted   Shoulder dislocation, right, initial encounter 09/08/2021   Indication for care in labor and delivery, antepartum 05/17/2016   Encounter for supervision of other normal pregnancy, third trimester 05/17/2016    REFERRING DIAG: S43.004A (ICD-10-CM) - Shoulder dislocation, right, initial encounter M25.511 (ICD-10-CM) - Acute pain of right shoulder   THERAPY DIAG: Right shoulder pain with hx of shoulder dislocation  Rationale for Evaluation and Treatment Rehabilitation  PERTINENT HISTORY: Theresa Banks is here for follow-up on her right shoulder.  She is 1 month status post right shoulder dislocation which happened when she was in [place].  She did have to be taken to the operating room to have this closed reduction.  She has not had previous history of dislocation.  She has been in a sling and is here for follow-up.  She does feel like her shoulder is locking up at night.  Relates that it "feels like it needs to be popped ".  She still has some numbness in her forearm.  She can lift her arm if she is laying on her back but not when she is sitting.  She has been taking Tylenol as needed.   PRECAUTIONS: Shoulder  SUBJECTIVE: Reports  less pain, none at rest.  Feels shoulder is unstable and now describes paresthesias in RUE   PAIN:  Are you having pain? Yes: NPRS scale: 4/10 Pain location: R shoulder Pain description: ache Aggravating factors: movement Relieving factors: rest   OBJECTIVE: (objective measures completed at initial evaluation unless otherwise dated)  DIAGNOSTIC FINDINGS:  none   PATIENT SURVEYS:  Quick Dash 31/55   COGNITION:           Overall cognitive status: Within functional limits for tasks assessed                                  SENSATION: Not tested   POSTURE: Elevated R shoulder posture    UPPER EXTREMITY ROM:    A/PROM Right eval Left eval  Shoulder flexion 30/120d    Shoulder extension WNL    Shoulder abduction 30/80d    Shoulder adduction      Shoulder internal rotation 70/70d    Shoulder external rotation 40/40d    Elbow flexion WNL    Elbow extension WNL    Wrist flexion      Wrist extension      Wrist ulnar deviation      Wrist radial deviation      Wrist pronation  Wrist supination      (Blank rows = not tested)   UPPER EXTREMITY MMT:   MMT Right eval Left eval  Shoulder flexion 2    Shoulder extension 3    Shoulder abduction 2    Shoulder adduction      Shoulder internal rotation 4    Shoulder external rotation 3    Middle trapezius      Lower trapezius      Elbow flexion      Elbow extension      Wrist flexion      Wrist extension      Wrist ulnar deviation      Wrist radial deviation      Wrist pronation      Wrist supination      Grip strength (lbs)      (Blank rows = not tested)   SHOULDER SPECIAL TESTS:            Open can, drop arm positive R   JOINT MOBILITY TESTING:  deferred   PALPATION:  Atrophy of R supraspinatus muscle             TODAY'S TREATMENT:  [facility] Adult PT Treatment:                                                DATE: 10/18/21 Therapeutic Exercise: UBE L1 5 min fwd Supine R elbow flexion 500g 15x2 Supine  press 500g ball 15x2 Supine ER YTB 15x2 Supine RUE flexion 15x2 Scapular shrugs and retractions 15x each Standing rows and shoulder extension YTB 15x ea.       PATIENT EDUCATION: Education details: Discussed eval findings, rehab rationale and POC and patient is in agreement  Person educated: Patient Education method: Explanation Education comprehension: verbalized understanding and needs further education     HOME EXERCISE PROGRAM: Access Code: [email or web address] URL: [email or web address] Date: 10/01/2021 Prepared by: Sharlynn Oliphant   Exercises - Standing Shoulder Abduction ROM with Dowel  - 2 x daily - 7 x weekly - 2 sets - 10 reps - Supine Shoulder Flexion with Dowel  - 2 x daily - 7 x weekly - 2 sets - 10 reps   ASSESSMENT:   CLINICAL IMPRESSION: No change to report regarding function or ability to raise arm.  Pain mild overall.  Marked substitution noted when attempting to raise arm into flexion and abduction, unable to hold arm in scaption, positive drop arm test.  Patient to f/u with ortho MD 10/20/21     OBJECTIVE IMPAIRMENTS decreased knowledge of condition, decreased mobility, decreased ROM, decreased strength, hypomobility, impaired UE functional use, and pain.    ACTIVITY LIMITATIONS carrying, lifting, bed mobility, dressing, and reach over head   PERSONAL FACTORS Past/current experiences are also affecting patient's functional outcome.    REHAB POTENTIAL: Fair based on suspected RC tear   CLINICAL DECISION MAKING: Evolving/moderate complexity   EVALUATION COMPLEXITY: Moderate     GOALS: Goals reviewed with patient? No   SHORT TERM GOALS=LONG TERM OALS: Target date: 10/29/2021     Patient to demonstrate independence in HEP Baseline:[id number] Goal status: INITIAL   2.  Increase AROM R shoulder to 160d flexion and abduction Baseline:  A/PROM Right eval Left eval  Shoulder flexion 30/120d    Shoulder extension WNL    Shoulder abduction 30/80d     Shoulder adduction  Shoulder internal rotation 70/70d    Shoulder external rotation 40/40d    Elbow flexion WNL    Elbow extension WNL      Goal status: INITIAL   3.  Decrease pain to 3/10 Baseline: 5/10 Goal status: INITIAL   4.  Decrease DASH score to 23/55 Baseline: 31/55 Goal status: INITIAL         PLAN: PT FREQUENCY: 1x/week   PT DURATION: 4 weeks   PLANNED INTERVENTIONS: Therapeutic exercises, Therapeutic activity, Neuromuscular re-education, Balance training, Gait training, Patient/Family education, Self Care, Joint mobilization, and Manual therapy   PLAN FOR NEXT SESSION: ROM, aerobic conditioning, R shoulder/RC strengthening as tolerated    Lanice Shirts, PT 10/18/2021, 12:32 PM

## 2021-10-20 ENCOUNTER — Ambulatory Visit: Payer: Medicaid Other | Admitting: Orthopaedic Surgery

## 2021-10-22 ENCOUNTER — Telehealth: Payer: Self-pay | Admitting: Orthopaedic Surgery

## 2021-10-22 NOTE — Telephone Encounter (Signed)
Patient wants to know if she can get a referrel form Dr Durward Fortes to go a dr that takes her insurance? [phone]

## 2021-10-25 ENCOUNTER — Ambulatory Visit: Payer: BLUE CROSS/BLUE SHIELD

## 2021-10-25 NOTE — Telephone Encounter (Signed)
We had suggested an MRI of her shoulder- not sure where we are in the scheduling process

## 2021-10-25 NOTE — Telephone Encounter (Signed)
Called patient. She said that her insurance is changing and she therefore I could disregard this message.

## 2021-10-27 ENCOUNTER — Ambulatory Visit: Payer: BLUE CROSS/BLUE SHIELD

## 2021-11-10 ENCOUNTER — Other Ambulatory Visit: Payer: Self-pay

## 2021-11-10 ENCOUNTER — Ambulatory Visit: Payer: 59

## 2021-11-10 ENCOUNTER — Ambulatory Visit: Payer: 59 | Attending: Orthopaedic Surgery

## 2021-11-10 DIAGNOSIS — M25619 Stiffness of unspecified shoulder, not elsewhere classified: Secondary | ICD-10-CM | POA: Diagnosis present

## 2021-11-10 DIAGNOSIS — M25511 Pain in right shoulder: Secondary | ICD-10-CM | POA: Insufficient documentation

## 2021-11-10 DIAGNOSIS — S43014A Anterior dislocation of right humerus, initial encounter: Secondary | ICD-10-CM | POA: Insufficient documentation

## 2021-11-10 NOTE — Therapy (Addendum)
OUTPATIENT PHYSICAL THERAPY TREATMENT NOTE/DC SUMMARY   Patient Name: Theresa Banks MRN: [id number] DOB:1980/04/19, 41 y.o., female Today's Date: 11/10/2021  PCP: Patient, No Pcp Per REFERRING PROVIDER: Garald Balding, MD PHYSICAL THERAPY DISCHARGE SUMMARY  Visits from Start of Care: 3  Current functional level related to goals / functional outcomes: No changes   Remaining deficits: Pain and weakness   Education / Equipment: HEP   Patient agrees to discharge. Patient goals were partially met. Patient is being discharged due to did not respond to therapy.   END OF SESSION:   PT End of Session - 11/10/21 1003     Visit Number 3    Number of Visits 4    Authorization Type BCBS    PT Start Time 1005    PT Stop Time 1045    PT Time Calculation (min) 40 min    Activity Tolerance Patient tolerated treatment well    Behavior During Therapy WFL for tasks assessed/performed             Past Medical History:  Diagnosis Date   Anemia    Headache    Past Surgical History:  Procedure Laterality Date   HAND DEBRIDEMENT     Patient Active Problem List   Diagnosis Date Noted   Shoulder dislocation, right, initial encounter 09/08/2021   Indication for care in labor and delivery, antepartum 05/17/2016   Encounter for supervision of other normal pregnancy, third trimester 05/17/2016    REFERRING DIAG: S43.004A (ICD-10-CM) - Shoulder dislocation, right, initial encounter M25.511 (ICD-10-CM) - Acute pain of right shoulder   THERAPY DIAG: Reports no pain in R shoulder  Rationale for Evaluation and Treatment Rehabilitation  PERTINENT HISTORY: [name] is here for follow-up on her right shoulder.  She is 1 month status post right shoulder dislocation which happened when she was in [place].  She did have to be taken to the operating room to have this closed reduction.  She has not had previous history of dislocation.  She has been in a sling and is  here for follow-up.  She does feel like her shoulder is locking up at night.  Relates that it "feels like it needs to be popped ".  She still has some numbness in her forearm.  She can lift her arm if she is laying on her back but not when she is sitting.  She has been taking Tylenol as needed.   PRECAUTIONS: Shoulder, suspected RC tear  SUBJECTIVE: Patient report no shoulder pain and paresthesias, feels she has more mobility but cannot raise arm OH due to weakness.  PAIN:  Are you having pain? Yes: NPRS scale: 4/10 Pain location: R shoulder Pain description: ache Aggravating factors: movement Relieving factors: rest   OBJECTIVE: (objective measures completed at initial evaluation unless otherwise dated)  DIAGNOSTIC FINDINGS:  none   PATIENT SURVEYS:  Quick Dash 31/55   COGNITION:           Overall cognitive status: Within functional limits for tasks assessed                                  SENSATION: Not tested   POSTURE: Elevated R shoulder posture    UPPER EXTREMITY ROM:    A/PROM Right eval Left eval R 11/10/21  Shoulder flexion 30/120d   90/150d  Shoulder extension WNL   WNL  Shoulder abduction 30/80d   40/150d  Shoulder adduction  Shoulder internal rotation 70/70d   70/70d  Shoulder external rotation 40/40d   30/50d  Elbow flexion WNL   WNL  Elbow extension WNL   WNL  Wrist flexion       Wrist extension       Wrist ulnar deviation       Wrist radial deviation       Wrist pronation       Wrist supination       (Blank rows = not tested)   UPPER EXTREMITY MMT:   MMT Right eval Left eval R 11/10/21  Shoulder flexion 2   2+  Shoulder extension 3   3+  Shoulder abduction 2   2+  Shoulder adduction       Shoulder internal rotation 4   4  Shoulder external rotation 3   3  Middle trapezius       Lower trapezius       Elbow flexion       Elbow extension       Wrist flexion       Wrist extension       Wrist ulnar deviation       Wrist radial  deviation       Wrist pronation       Wrist supination       Grip strength (lbs)       (Blank rows = not tested)   SHOULDER SPECIAL TESTS:            Open can, drop arm positive R SHOULDER SPECIAL TESTS: 11/10/21 Rotator cuff assessment: Drop arm test: positive Belly press test positive    JOINT MOBILITY TESTING:  deferred   PALPATION:  Atrophy of R supraspinatus muscle             TODAY'S TREATMENT:  [facility] Adult PT Treatment:                                                DATE: 11/10/21 Therapeutic Exercise: Nustep L2 6 min Supine elbow flexion 1000g ball 2x15 Supine chest press 1000g ball 2x15 R wrist flex 1000g ball 2x15 R wrist extension 1000g ball 2x15 R 4-way scapula against manual resistance 15x ea.  Rhythmic stabilization 90d flexion 60s x2 PROM all directions R PNF patterns AAROM D1 F/E 15x   [facility] Adult PT Treatment:                                                DATE: 10/18/21 Therapeutic Exercise: UBE L1 5 min fwd Supine R elbow flexion 500g 15x2 Supine press 500g ball 15x2 Supine ER YTB 15x2 Supine RUE flexion 15x2 Scapular shrugs and retractions 15x each Standing rows and shoulder extension YTB 15x ea.       PATIENT EDUCATION: Education details: Discussed eval findings, rehab rationale and POC and patient is in agreement  Person educated: Patient Education method: Explanation Education comprehension: verbalized understanding and needs further education     HOME EXERCISE PROGRAM: Access Code: [email or web address] URL: [email or web address] Date: 10/01/2021 Prepared by: Sharlynn Oliphant   Exercises - Standing Shoulder Abduction ROM with Dowel  - 2 x daily - 7 x weekly - 2 sets - 10 reps - Supine Shoulder Flexion with Dowel  -  2 x daily - 7 x weekly - 2 sets - 10 reps   ASSESSMENT:   CLINICAL IMPRESSION: Has had to cancel MD appointment as well as pending MRI due to insurance/financial reasons. Slight gains in AROM noted but marked substitution  noted when attempting to raise arm.  No pain reported with AROM.  PROM restricted primarily in ER and less so in flex/abd most likely due to disuse.  Patient continues to demonstrate signs of RC tear and has been encouraged to f/u with ortho consult to definitively determine underlying shoulder pathology.   OBJECTIVE IMPAIRMENTS decreased knowledge of condition, decreased mobility, decreased ROM, decreased strength, hypomobility, impaired UE functional use, and pain.    ACTIVITY LIMITATIONS carrying, lifting, bed mobility, dressing, and reach over head   PERSONAL FACTORS Past/current experiences are also affecting patient's functional outcome.    REHAB POTENTIAL: Fair based on suspected RC tear   CLINICAL DECISION MAKING: Evolving/moderate complexity   EVALUATION COMPLEXITY: Moderate     GOALS: Goals reviewed with patient? No   SHORT TERM GOALS=LONG TERM OALS: Target date: 10/29/2021     Patient to demonstrate independence in HEP Baseline:[id number] Goal status: Patient reports compliance with initial HEP   2.  Increase AROM R shoulder to 160d flexion and abduction Baseline:  A/PROM Right eval Left eval R 11/10/21  Shoulder flexion 30/120d   90/150d  Shoulder extension WNL   WNL  Shoulder abduction 30/80d   40/150d  Shoulder adduction       Shoulder internal rotation 70/70d   70/70d  Shoulder external rotation 40/40d   30/50d  Elbow flexion WNL   WNL  Elbow extension WNL   WNL    MMT Right eval Left eval R 11/10/21  Shoulder flexion 2   2+  Shoulder extension 3   3+  Shoulder abduction 2   2+  Shoulder adduction       Shoulder internal rotation 4   4  Shoulder external rotation 3   3   Goal status: Ongoing   3.  Decrease pain to 3/10 Baseline: 5/10; 11/10/21 No pain reported Goal status: Met   4.  Decrease DASH score to 23/55 Baseline: 31/55 Goal status: Ongoing        PLAN: PT FREQUENCY: 1x/week   PT DURATION: 4 weeks   PLANNED INTERVENTIONS: Therapeutic  exercises, Therapeutic activity, Neuromuscular re-education, Balance training, Gait training, Patient/Family education, Self Care, Joint mobilization, and Manual therapy   PLAN FOR NEXT SESSION: ROM, aerobic conditioning, R shoulder/RC strengthening as tolerated, f/u with MD to determine underlying shoulder pathology    Lanice Shirts, PT 11/10/2021, 11:07 AM

## 2021-11-16 ENCOUNTER — Encounter: Payer: Self-pay | Admitting: Orthopaedic Surgery

## 2021-11-16 ENCOUNTER — Ambulatory Visit (INDEPENDENT_AMBULATORY_CARE_PROVIDER_SITE_OTHER): Payer: Commercial Managed Care - HMO | Admitting: Orthopaedic Surgery

## 2021-11-16 DIAGNOSIS — S43431S Superior glenoid labrum lesion of right shoulder, sequela: Secondary | ICD-10-CM | POA: Diagnosis not present

## 2021-11-16 DIAGNOSIS — M7501 Adhesive capsulitis of right shoulder: Secondary | ICD-10-CM | POA: Diagnosis not present

## 2021-11-16 DIAGNOSIS — S43004A Unspecified dislocation of right shoulder joint, initial encounter: Secondary | ICD-10-CM

## 2021-11-16 NOTE — Progress Notes (Signed)
Office Visit Note   Patient: Theresa Banks           Date of Birth: [birth year]           MRN: [id number] Visit Date: 11/16/2021              Requested by: No referring provider defined for this encounter. PCP: Patient, No Pcp Per   Assessment & Plan: Visit Diagnoses:  1. Shoulder dislocation, right, initial encounter   2. Labral tear of shoulder, right, sequela   3. Adhesive capsulitis of right shoulder     Plan: [name] is a pleasant 41 year old woman who is 3 months status post right shoulder dislocation.  She has been doing physical therapy and has not been progressing with regards to range of motion.  She is quite limited with forward elevation and can only go to about 85 degrees and then engages the accessory muscles.  She is also quite painful.  Her exam does not demonstrate any neurologic damage from her dislocation.  She has no scapular winging no paresthesias.  She does have an element of frozen shoulder.  Given the length of time since injury and failure to improve significantly with physical therapy we recommend an MRI arthrogram.  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  She will follow-up once this is completed.  If she cannot get an arthrogram would go forward with a regular MRI without contrast  Follow-Up Instructions: Return in about 1 week (around 11/23/2021), or after MRI.   Orders:  Orders Placed This Encounter  Procedures   MR SHOULDER RIGHT W CONTRAST   Arthrogram   No orders of the defined types were placed in this encounter.     Procedures: No procedures performed   Clinical Data: No additional findings.   Subjective: Chief Complaint  Patient presents with   Right Shoulder - Follow-up    HPI Theresa Banks is here 3 months status post right anterior shoulder dislocation.  She has been working with physical therapy on range of motion and has had no significant  improvement in range of motion.  Wondering if it be now appropriate to go forward with an MRI to rule out a labral tear or rotator cuff pathology though unlikely given her age Review of Systems  All other systems reviewed and are negative.    Objective: Vital Signs: There were no vitals taken for this visit.  Physical Exam Constitutional:      Appearance: Normal appearance.  Pulmonary:     Effort: Pulmonary effort is normal.  Neurological:     General: No focal deficit present.     Mental Status: She is alert and oriented to person, place, and time.     Ortho Exam Right shoulder no redness no erythema.  Distally she is neurovascularly intact good grip strength.  She has no scapular winging she is able to lift her arm away from her back with internal rotation.  She has forward elevation to about 85 degrees abduction to about 70 degrees.  No muscular atrophy noted no neck pain Specialty Comments:  No specialty comments available.  Imaging: No results found.   PMFS History: Patient Active Problem List   Diagnosis Date Noted   Labral tear of shoulder, right, sequela 11/16/2021   Adhesive capsulitis of right shoulder 11/16/2021   Shoulder dislocation, right, initial encounter 09/08/2021   Indication for care in labor and delivery, antepartum 05/17/2016  Encounter for supervision of other normal pregnancy, third trimester 05/17/2016   Past Medical History:  Diagnosis Date   Anemia    Headache     Family History  Problem Relation Age of Onset   Birth defects Daughter        ASD reapired at 20 YO    Past Surgical History:  Procedure Laterality Date   HAND DEBRIDEMENT     Social History   Occupational History   Not on file  Tobacco Use   Smoking status: Every Day    Packs/day: 0.50    Types: Cigarettes   Smokeless tobacco: Never  Substance and Sexual Activity   Alcohol use: Yes    Comment: socially   Drug use: No   Sexual activity: Yes

## 2021-12-10 ENCOUNTER — Ambulatory Visit
Admission: RE | Admit: 2021-12-10 | Discharge: 2021-12-10 | Disposition: A | Discharge status: Home / Self Care | Payer: 59 | Source: Ambulatory Visit | Attending: Orthopaedic Surgery | Admitting: Orthopaedic Surgery

## 2021-12-10 DIAGNOSIS — S43004A Unspecified dislocation of right shoulder joint, initial encounter: Secondary | ICD-10-CM

## 2021-12-10 MED ORDER — IOPAMIDOL (ISOVUE-M 200) INJECTION 41%
12.0000 mL | Freq: Once | INTRAMUSCULAR | Status: AC
  Administered 2021-12-10: 12 mL via INTRA_ARTICULAR

## 2021-12-14 NOTE — Progress Notes (Signed)
Please refer to Dr August Saucer for rotator cuff repair-thanks-call pt and relate results of MRI

## 2021-12-15 ENCOUNTER — Ambulatory Visit: Payer: 59 | Admitting: Orthopaedic Surgery

## 2021-12-22 ENCOUNTER — Ambulatory Visit (INDEPENDENT_AMBULATORY_CARE_PROVIDER_SITE_OTHER): Payer: Commercial Managed Care - HMO | Admitting: Orthopedic Surgery

## 2021-12-22 DIAGNOSIS — S43004A Unspecified dislocation of right shoulder joint, initial encounter: Secondary | ICD-10-CM

## 2021-12-25 ENCOUNTER — Encounter: Payer: Self-pay | Admitting: Orthopedic Surgery
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
## 2022-01-28 NOTE — Pre-Procedure Instructions (Signed)
Surgical Instructions    Your procedure is scheduled on Thursday, January 25th.  Report to [facility] Main Entrance "A" at 1:00 P.M., then check in with the Admitting office.  Call this number if you have problems the morning of surgery:  [phone]  If you have any questions prior to your surgery date call [phone]: Open Monday-Friday 8am-4pm If you experience any cold or flu symptoms such as cough, fever, chills, shortness of breath, etc. between now and your scheduled surgery, please notify us at the above number.     Remember:  Do not eat after midnight the night before your surgery  You may drink clear liquids until 12:00 PM the day of your surgery.   Clear liquids allowed are: Water, Non-Citrus Juices (without pulp), Carbonated Beverages, Clear Tea, Black Coffee Only (NO MILK, CREAM OR POWDERED CREAMER of any kind), and Gatorade.   Patient Instructions  The night before surgery:  No food after midnight. ONLY clear liquids after midnight  The day of surgery (if you do NOT have diabetes):  Drink ONE (1) Pre-Surgery Clear Ensure by 12:00 PM the day of surgery. Drink in one sitting. Do not sip.  This drink was given to you during your hospital  pre-op appointment visit.  Nothing else to drink after completing the  Pre-Surgery Clear Ensure.          If you have questions, please contact your surgeon's office.     Take these medicines the morning of surgery with A SIP OF WATER: none  As of today, STOP taking any Aspirin (unless otherwise instructed by your surgeon) Aleve, Naproxen, Ibuprofen, Motrin, Advil, Goody's, BC's, all herbal medications, fish oil, and all vitamins.                     Do NOT Smoke (Tobacco/Vaping) for 24 hours prior to your procedure.  If you use a CPAP at night, you may bring your mask/headgear for your overnight stay.   Contacts, glasses, piercing's, hearing aid's, dentures or partials may not be worn into surgery, please bring cases for  these belongings.    For patients admitted to the hospital, discharge time will be determined by your treatment team.   Patients discharged the day of surgery will not be allowed to drive home, and someone needs to stay with them for 24 hours.  SURGICAL WAITING ROOM VISITATION Patients having surgery or a procedure may have no more than 2 support people in the waiting area - these visitors may rotate.   Children under the age of 72 must have an adult with them who is not the patient. If the patient needs to stay at the hospital during part of their recovery, the visitor guidelines for inpatient rooms apply. Pre-op nurse will coordinate an appropriate time for 1 support person to accompany patient in pre-op.  This support person may not rotate.   Please refer to the [facility] website for the visitor guidelines for Inpatients (after your surgery is over and you are in a regular room).    Special instructions:   [facility]- Preparing For Surgery  Before surgery, you can play an important role. Because skin is not sterile, your skin needs to be as free of germs as possible. You can reduce the number of germs on your skin by washing with CHG (chlorahexidine gluconate) Soap before surgery.  CHG is an antiseptic cleaner which kills germs and bonds with the skin to continue killing germs even after washing.  Oral Hygiene is also important to reduce your risk of infection.  Remember - BRUSH YOUR TEETH THE MORNING OF SURGERY WITH YOUR REGULAR TOOTHPASTE  Please do not use if you have an allergy to CHG or antibacterial soaps. If your skin becomes reddened/irritated stop using the CHG.  Do not shave (including legs and underarms) for at least 48 hours prior to first CHG shower. It is OK to shave your face.  Please follow these instructions carefully.   Shower the NIGHT BEFORE SURGERY and the MORNING OF SURGERY  If you chose to wash your hair, wash your hair first as usual with your normal  shampoo.  After you shampoo, rinse your hair and body thoroughly to remove the shampoo.  Use CHG Soap as you would any other liquid soap. You can apply CHG directly to the skin and wash gently with a scrungie or a clean washcloth.   Apply the CHG Soap to your body ONLY FROM THE NECK DOWN.  Do not use on open wounds or open sores. Avoid contact with your eyes, ears, mouth and genitals (private parts). Wash Face and genitals (private parts)  with your normal soap.   Wash thoroughly, paying special attention to the area where your surgery will be performed.  Thoroughly rinse your body with warm water from the neck down.  DO NOT shower/wash with your normal soap after using and rinsing off the CHG Soap.  Pat yourself dry with a CLEAN TOWEL.  Wear CLEAN PAJAMAS to bed the night before surgery  Place CLEAN SHEETS on your bed the night before your surgery  DO NOT SLEEP WITH PETS.   Day of Surgery: Take a shower with CHG soap. Do not wear jewelry or makeup Do not wear lotions, powders, perfumes, or deodorant. Do not shave 48 hours prior to surgery.   Do not bring valuables to the hospital. [facility] is not responsible for any belongings or valuables. Do not wear nail polish, gel polish, artificial nails, or any other type of covering on natural nails (fingers and toes) If you have artificial nails or gel coating that need to be removed by a nail salon, please have this removed prior to surgery. Artificial nails or gel coating may interfere with anesthesia's ability to adequately monitor your vital signs. Wear Clean/Comfortable clothing the morning of surgery Remember to brush your teeth WITH YOUR REGULAR TOOTHPASTE.   Please read over the following fact sheets that you were given.    If you received a COVID test during your pre-op visit  it is requested that you wear a mask when out in public, stay away from anyone that may not be feeling well and notify your surgeon if you develop  symptoms. If you have been in contact with anyone that has tested positive in the last 10 days please notify you surgeon.

## 2022-01-31 ENCOUNTER — Other Ambulatory Visit: Payer: Self-pay

## 2022-01-31 ENCOUNTER — Encounter (HOSPITAL_COMMUNITY): Payer: Self-pay | Admitting: *Deleted

## 2022-01-31 ENCOUNTER — Encounter (HOSPITAL_COMMUNITY)
Admission: RE | Admit: 2022-01-31 | Discharge: 2022-01-31 | Disposition: A | Discharge status: Home / Self Care | Payer: Commercial Managed Care - HMO | Source: Ambulatory Visit | Attending: Orthopedic Surgery | Admitting: Orthopedic Surgery

## 2022-01-31 VITALS — BP 126/82 | HR 78 | Temp 98.1°F | Resp 17 | Ht 69.0 in | Wt 229.1 lb

## 2022-01-31 DIAGNOSIS — Z01818 Encounter for other preprocedural examination: Secondary | ICD-10-CM

## 2022-01-31 DIAGNOSIS — Z01812 Encounter for preprocedural laboratory examination: Secondary | ICD-10-CM | POA: Insufficient documentation

## 2022-01-31 LAB — CBC
HCT: 37.1 % (ref 36.0–46.0)
Hemoglobin: 11.9 g/dL — ABNORMAL LOW (ref 12.0–15.0)
MCH: 29 pg (ref 26.0–34.0)
MCHC: 32.1 g/dL (ref 30.0–36.0)
MCV: 90.3 fL (ref 80.0–100.0)
Platelets: 308 10*3/uL (ref 150–400)
RBC: 4.11 MIL/uL (ref 3.87–5.11)
RDW: 12.9 % (ref 11.5–15.5)
WBC: 5.3 10*3/uL (ref 4.0–10.5)
nRBC: 0 % (ref 0.0–0.2)

## 2022-01-31 LAB — BASIC METABOLIC PANEL
Anion gap: 10 (ref 5–15)
BUN: 5 mg/dL — ABNORMAL LOW (ref 6–20)
CO2: 22 mmol/L (ref 22–32)
Calcium: 8.9 mg/dL (ref 8.9–10.3)
Chloride: 105 mmol/L (ref 98–111)
Creatinine, Ser: 0.8 mg/dL (ref 0.44–1.00)
GFR, Estimated: >60 mL/min (ref >60)
Glucose, Bld: 104 mg/dL — ABNORMAL HIGH (ref 70–99)
Potassium: 4 mmol/L (ref 3.5–5.1)
Sodium: 137 mmol/L (ref 135–145)

## 2022-01-31 NOTE — Progress Notes (Signed)
PCP - denies Cardiologist - denies  PPM/ICD - denies   Chest x-ray - denies EKG - denies Stress Test - denies ECHO - denies Cardiac Cath - denies  Sleep Study - denies   DM- denies    ASA/Blood Thinner Instructions: n/a   ERAS Protcol - yes PRE-SURGERY Ensure given at PAT  COVID TEST- n/a   Anesthesia review: no  Patient denies shortness of breath, fever, cough and chest pain at PAT appointment   All instructions explained to the patient, with a verbal understanding of the material. Patient agrees to go over the instructions while at home for a better understanding.  The opportunity to ask questions was provided.

## 2022-02-03 ENCOUNTER — Ambulatory Visit (HOSPITAL_COMMUNITY): Payer: Commercial Managed Care - HMO | Admitting: Anesthesiology

## 2022-02-03 ENCOUNTER — Ambulatory Visit (HOSPITAL_BASED_OUTPATIENT_CLINIC_OR_DEPARTMENT_OTHER): Payer: Commercial Managed Care - HMO | Admitting: Anesthesiology

## 2022-02-03 ENCOUNTER — Other Ambulatory Visit: Payer: Self-pay

## 2022-02-03 ENCOUNTER — Encounter (HOSPITAL_COMMUNITY)
Admission: RE | Disposition: A | Discharge status: Home / Self Care | Payer: Self-pay | Source: Home / Self Care | Attending: Orthopedic Surgery

## 2022-02-03 ENCOUNTER — Ambulatory Visit (HOSPITAL_COMMUNITY)
Admission: RE | Admit: 2022-02-03 | Discharge: 2022-02-03 | Disposition: A | Discharge status: Home / Self Care | Payer: Commercial Managed Care - HMO | Attending: Orthopedic Surgery | Admitting: Orthopedic Surgery

## 2022-02-03 ENCOUNTER — Ambulatory Visit (HOSPITAL_COMMUNITY): Payer: Commercial Managed Care - HMO

## 2022-02-03 ENCOUNTER — Encounter (HOSPITAL_COMMUNITY): Payer: Self-pay | Admitting: Orthopedic Surgery

## 2022-02-03 DIAGNOSIS — S46011D Strain of muscle(s) and tendon(s) of the rotator cuff of right shoulder, subsequent encounter: Secondary | ICD-10-CM

## 2022-02-03 DIAGNOSIS — Z6830 Body mass index (BMI) 30.0-30.9, adult: Secondary | ICD-10-CM | POA: Insufficient documentation

## 2022-02-03 DIAGNOSIS — Z01818 Encounter for other preprocedural examination: Secondary | ICD-10-CM

## 2022-02-03 DIAGNOSIS — E669 Obesity, unspecified: Secondary | ICD-10-CM | POA: Insufficient documentation

## 2022-02-03 DIAGNOSIS — S43001A Unspecified subluxation of right shoulder joint, initial encounter: Secondary | ICD-10-CM | POA: Diagnosis not present

## 2022-02-03 DIAGNOSIS — Z87891 Personal history of nicotine dependence: Secondary | ICD-10-CM | POA: Diagnosis not present

## 2022-02-03 DIAGNOSIS — S43001D Unspecified subluxation of right shoulder joint, subsequent encounter: Secondary | ICD-10-CM | POA: Diagnosis not present

## 2022-02-03 DIAGNOSIS — S46011A Strain of muscle(s) and tendon(s) of the rotator cuff of right shoulder, initial encounter: Secondary | ICD-10-CM

## 2022-02-03 DIAGNOSIS — M1990 Unspecified osteoarthritis, unspecified site: Secondary | ICD-10-CM | POA: Insufficient documentation

## 2022-02-03 DIAGNOSIS — M75101 Unspecified rotator cuff tear or rupture of right shoulder, not specified as traumatic: Secondary | ICD-10-CM | POA: Diagnosis not present

## 2022-02-03 DIAGNOSIS — W1789XA Other fall from one level to another, initial encounter: Secondary | ICD-10-CM | POA: Insufficient documentation

## 2022-02-03 DIAGNOSIS — S43004A Unspecified dislocation of right shoulder joint, initial encounter: Secondary | ICD-10-CM | POA: Diagnosis not present

## 2022-02-03 DIAGNOSIS — S43003A Unspecified subluxation of unspecified shoulder joint, initial encounter: Secondary | ICD-10-CM

## 2022-02-03 HISTORY — PX: BICEPT TENODESIS: SHX5116

## 2022-02-03 HISTORY — PX: SHOULDER ARTHROSCOPY WITH ROTATOR CUFF REPAIR AND SUBACROMIAL DECOMPRESSION: SHX5686

## 2022-02-03 LAB — POCT PREGNANCY, URINE: Preg Test, Ur: NEGATIVE

## 2022-02-03 SURGERY — SHOULDER ARTHROSCOPY WITH ROTATOR CUFF REPAIR AND SUBACROMIAL DECOMPRESSION
Anesthesia: General | Site: Shoulder | Laterality: Right

## 2022-02-03 MED ORDER — EPINEPHRINE PF 1 MG/ML IJ SOLN
INTRAMUSCULAR | Status: AC
  Filled 2022-02-03: qty 1

## 2022-02-03 MED ORDER — VANCOMYCIN HCL 1000 MG IV SOLR
INTRAVENOUS | Status: DC | PRN
  Administered 2022-02-03: 1000 mg via TOPICAL

## 2022-02-03 MED ORDER — HYDROMORPHONE HCL 1 MG/ML IJ SOLN
INTRAMUSCULAR | Status: DC | PRN
  Administered 2022-02-03: .25 mg via INTRAVENOUS

## 2022-02-03 MED ORDER — BUPIVACAINE HCL (PF) 0.5 % IJ SOLN
INTRAMUSCULAR | Status: DC | PRN
  Administered 2022-02-03: 20 mL via PERINEURAL

## 2022-02-03 MED ORDER — ROCURONIUM BROMIDE 10 MG/ML (PF) SYRINGE
PREFILLED_SYRINGE | INTRAVENOUS | Status: DC | PRN
  Administered 2022-02-03 (×2): 20 mg via INTRAVENOUS
  Administered 2022-02-03: 60 mg via INTRAVENOUS
  Administered 2022-02-03: 20 mg via INTRAVENOUS

## 2022-02-03 MED ORDER — VANCOMYCIN HCL 1000 MG IV SOLR
INTRAVENOUS | Status: AC
  Filled 2022-02-03: qty 20

## 2022-02-03 MED ORDER — FENTANYL CITRATE (PF) 250 MCG/5ML IJ SOLN
INTRAMUSCULAR | Status: AC
  Filled 2022-02-03: qty 5

## 2022-02-03 MED ORDER — PROPOFOL 10 MG/ML IV BOLUS
INTRAVENOUS | Status: AC
  Filled 2022-02-03: qty 20

## 2022-02-03 MED ORDER — FENTANYL CITRATE (PF) 100 MCG/2ML IJ SOLN: 100.0000 ug | Freq: Once | INTRAMUSCULAR | Status: AC

## 2022-02-03 MED ORDER — BUPIVACAINE LIPOSOME 1.3 % IJ SUSP
INTRAMUSCULAR | Status: DC | PRN
  Administered 2022-02-03: 10 mL via PERINEURAL

## 2022-02-03 MED ORDER — LIDOCAINE 2% (20 MG/ML) 5 ML SYRINGE
INTRAMUSCULAR | Status: DC | PRN
  Administered 2022-02-03: 40 mg via INTRAVENOUS

## 2022-02-03 MED ORDER — SUGAMMADEX SODIUM 200 MG/2ML IV SOLN
INTRAVENOUS | Status: DC | PRN
  Administered 2022-02-03: 200 mg via INTRAVENOUS

## 2022-02-03 MED ORDER — SODIUM CHLORIDE 0.9 % IR SOLN
Status: DC | PRN
  Administered 2022-02-03: 1000 mL

## 2022-02-03 MED ORDER — HYDROMORPHONE HCL 1 MG/ML IJ SOLN: 0.2500 mg | INTRAMUSCULAR | Status: DC | PRN

## 2022-02-03 MED ORDER — HYDROMORPHONE HCL 1 MG/ML IJ SOLN
INTRAMUSCULAR | Status: AC
  Filled 2022-02-03: qty 0.5

## 2022-02-03 MED ORDER — PHENYLEPHRINE HCL-NACL 20-0.9 MG/250ML-% IV SOLN
INTRAVENOUS | Status: DC | PRN
  Administered 2022-02-03: 70 ug/min via INTRAVENOUS

## 2022-02-03 MED ORDER — OXYCODONE HCL 5 MG PO TABS: 5.0000 mg | ORAL_TABLET | ORAL | 0 refills | Status: AC | PRN

## 2022-02-03 MED ORDER — FENTANYL CITRATE (PF) 250 MCG/5ML IJ SOLN
INTRAMUSCULAR | Status: DC | PRN
  Administered 2022-02-03: 50 ug via INTRAVENOUS
  Administered 2022-02-03 (×2): 100 ug via INTRAVENOUS

## 2022-02-03 MED ORDER — ROCURONIUM BROMIDE 10 MG/ML (PF) SYRINGE
PREFILLED_SYRINGE | INTRAVENOUS | Status: AC
  Filled 2022-02-03: qty 10

## 2022-02-03 MED ORDER — ONDANSETRON HCL 4 MG/2ML IJ SOLN: 4.0000 mg | Freq: Once | INTRAMUSCULAR | Status: DC | PRN

## 2022-02-03 MED ORDER — ACETAMINOPHEN 10 MG/ML IV SOLN
INTRAVENOUS | Status: DC | PRN
  Administered 2022-02-03: 1000 mg via INTRAVENOUS

## 2022-02-03 MED ORDER — SODIUM CHLORIDE 0.9 % IR SOLN
Status: DC | PRN
  Administered 2022-02-03: 3000 mL

## 2022-02-03 MED ORDER — ACETAMINOPHEN 10 MG/ML IV SOLN
INTRAVENOUS | Status: AC
  Filled 2022-02-03: qty 100

## 2022-02-03 MED ORDER — METHOCARBAMOL 500 MG PO TABS: 500.0000 mg | ORAL_TABLET | Freq: Three times a day (TID) | ORAL | 1 refills | Status: AC | PRN

## 2022-02-03 MED ORDER — CHLORHEXIDINE GLUCONATE 0.12 % MT SOLN
15.0000 mL | Freq: Once | OROMUCOSAL | Status: AC
  Administered 2022-02-03: 15 mL via OROMUCOSAL
  Filled 2022-02-03: qty 15

## 2022-02-03 MED ORDER — ORAL CARE MOUTH RINSE: 15.0000 mL | Freq: Once | OROMUCOSAL | Status: AC

## 2022-02-03 MED ORDER — MIDAZOLAM HCL 2 MG/2ML IJ SOLN: 2.0000 mg | Freq: Once | INTRAMUSCULAR | Status: AC

## 2022-02-03 MED ORDER — EPHEDRINE SULFATE-NACL 50-0.9 MG/10ML-% IV SOSY
PREFILLED_SYRINGE | INTRAVENOUS | Status: DC | PRN
  Administered 2022-02-03 (×2): 5 mg via INTRAVENOUS

## 2022-02-03 MED ORDER — DEXAMETHASONE SODIUM PHOSPHATE 10 MG/ML IJ SOLN
INTRAMUSCULAR | Status: DC | PRN
  Administered 2022-02-03: 5 mg via INTRAVENOUS

## 2022-02-03 MED ORDER — GABAPENTIN 300 MG PO CAPS: 300.0000 mg | ORAL_CAPSULE | Freq: Three times a day (TID) | ORAL | 0 refills | Status: AC

## 2022-02-03 MED ORDER — SODIUM CHLORIDE 0.9 % IR SOLN
Status: DC | PRN
  Administered 2022-02-03: 3001 mL

## 2022-02-03 MED ORDER — TRANEXAMIC ACID-NACL 1000-0.7 MG/100ML-% IV SOLN
1000.0000 mg | INTRAVENOUS | Status: AC
  Administered 2022-02-03: 1000 mg via INTRAVENOUS
  Filled 2022-02-03: qty 100

## 2022-02-03 MED ORDER — ONDANSETRON HCL 4 MG/2ML IJ SOLN
INTRAMUSCULAR | Status: DC | PRN
  Administered 2022-02-03: 4 mg via INTRAVENOUS

## 2022-02-03 MED ORDER — CELECOXIB 100 MG PO CAPS: 100.0000 mg | ORAL_CAPSULE | Freq: Two times a day (BID) | ORAL | 0 refills | Status: AC

## 2022-02-03 MED ORDER — LACTATED RINGERS IV SOLN: INTRAVENOUS | Status: DC

## 2022-02-03 MED ORDER — PROPOFOL 10 MG/ML IV BOLUS
INTRAVENOUS | Status: DC | PRN
  Administered 2022-02-03: 150 mg via INTRAVENOUS

## 2022-02-03 MED ORDER — PHENYLEPHRINE 80 MCG/ML (10ML) SYRINGE FOR IV PUSH (FOR BLOOD PRESSURE SUPPORT)
PREFILLED_SYRINGE | INTRAVENOUS | Status: DC | PRN
  Administered 2022-02-03: 160 ug via INTRAVENOUS

## 2022-02-03 MED ORDER — CEFAZOLIN SODIUM-DEXTROSE 2-4 GM/100ML-% IV SOLN
2.0000 g | INTRAVENOUS | Status: AC
  Administered 2022-02-03 (×2): 2 g via INTRAVENOUS
  Filled 2022-02-03: qty 100

## 2022-02-03 MED ORDER — FENTANYL CITRATE (PF) 100 MCG/2ML IJ SOLN
INTRAMUSCULAR | Status: AC
  Administered 2022-02-03: 100 ug via INTRAVENOUS
  Filled 2022-02-03: qty 2

## 2022-02-03 MED ORDER — OXYCODONE HCL 5 MG/5ML PO SOLN: 5.0000 mg | Freq: Once | ORAL | Status: DC | PRN

## 2022-02-03 MED ORDER — OXYCODONE HCL 5 MG PO TABS: 5.0000 mg | ORAL_TABLET | Freq: Once | ORAL | Status: DC | PRN

## 2022-02-03 MED ORDER — MIDAZOLAM HCL 2 MG/2ML IJ SOLN
INTRAMUSCULAR | Status: AC
  Administered 2022-02-03: 2 mg via INTRAVENOUS
  Filled 2022-02-03: qty 2

## 2022-02-03 SURGICAL SUPPLY — 75 items
AID PSTN UNV HD RSTRNT DISP (MISCELLANEOUS) ×1
ALCOHOL 70% 16 OZ (MISCELLANEOUS) ×1 IMPLANT
ANCH SUT 2 FBRTK KNTLS 1.8 (Anchor) ×1 IMPLANT
ANCH SUT 2 SWLK 19.1 CLS EYLT (Anchor) ×4 IMPLANT
ANCH SUT FBRTK 1.3 2 TPE (Anchor) ×4 IMPLANT
ANCHOR FBRTK 2.6 SUTURETAP 1.3 (Anchor) IMPLANT
ANCHOR SUT 1.8 FIBERTAK SB KL (Anchor) IMPLANT
ANCHOR SWIVELOCK BIO 4.75X19.1 (Anchor) IMPLANT
BAG COUNTER SPONGE SURGICOUNT (BAG) ×1 IMPLANT
BAG SPNG CNTER NS LX DISP (BAG) ×1
BLADE CUTTER GATOR 3.5 (BLADE) IMPLANT
BLADE EXCALIBUR 4.0X13 (MISCELLANEOUS) IMPLANT
BLADE SURG 11 STRL SS (BLADE) IMPLANT
BUR OVAL 6.0 (BURR) IMPLANT
COOLER ICEMAN CLASSIC (MISCELLANEOUS) IMPLANT
COVER SURGICAL LIGHT HANDLE (MISCELLANEOUS) ×1 IMPLANT
DRAPE INCISE IOBAN 66X45 STRL (DRAPES) ×2 IMPLANT
DRAPE STERI 35X30 U-POUCH (DRAPES) ×1 IMPLANT
DRAPE U-SHAPE 47X51 STRL (DRAPES) ×2 IMPLANT
DRSG TEGADERM 4X4.75 (GAUZE/BANDAGES/DRESSINGS) ×3 IMPLANT
DURAPREP 26ML APPLICATOR (WOUND CARE) ×1 IMPLANT
ELECT REM PT RETURN 9FT ADLT (ELECTROSURGICAL) ×1
ELECTRODE REM PT RTRN 9FT ADLT (ELECTROSURGICAL) ×1 IMPLANT
FILTER STRAW FLUID ASPIR (MISCELLANEOUS) ×1 IMPLANT
GAUZE SPONGE 4X4 12PLY STRL (GAUZE/BANDAGES/DRESSINGS) ×1 IMPLANT
GAUZE XEROFORM 1X8 LF (GAUZE/BANDAGES/DRESSINGS) ×1 IMPLANT
GAUZE XEROFORM 5X9 LF (GAUZE/BANDAGES/DRESSINGS) IMPLANT
GLOVE BIOGEL PI IND STRL 7.0 (GLOVE) ×1 IMPLANT
GLOVE BIOGEL PI IND STRL 8 (GLOVE) ×1 IMPLANT
GLOVE ECLIPSE 7.0 STRL STRAW (GLOVE) ×1 IMPLANT
GLOVE ECLIPSE 8.0 STRL XLNG CF (GLOVE) ×1 IMPLANT
GOWN STRL REUS W/ TWL LRG LVL3 (GOWN DISPOSABLE) ×3 IMPLANT
GOWN STRL REUS W/TWL LRG LVL3 (GOWN DISPOSABLE) ×3
HYDROGEN PEROXIDE 16OZ (MISCELLANEOUS) ×1 IMPLANT
KIT BASIN OR (CUSTOM PROCEDURE TRAY) ×1 IMPLANT
KIT STR SPEAR 1.8 FBRTK DISP (KITS) IMPLANT
KIT TURNOVER KIT B (KITS) ×1 IMPLANT
MANIFOLD NEPTUNE II (INSTRUMENTS) ×1 IMPLANT
NDL HYPO 25X1 1.5 SAFETY (NEEDLE) ×1 IMPLANT
NDL SCORPION MULTI FIRE (NEEDLE) IMPLANT
NDL SPNL 18GX3.5 QUINCKE PK (NEEDLE) ×1 IMPLANT
NDL SUT 6 .5 CRC .975X.05 MAYO (NEEDLE) ×1 IMPLANT
NEEDLE HYPO 25X1 1.5 SAFETY (NEEDLE) ×1 IMPLANT
NEEDLE MAYO TAPER (NEEDLE) ×1
NEEDLE SCORPION MULTI FIRE (NEEDLE) ×2 IMPLANT
NEEDLE SPNL 18GX3.5 QUINCKE PK (NEEDLE) ×1 IMPLANT
NS IRRIG 1000ML POUR BTL (IV SOLUTION) ×1 IMPLANT
PACK SHOULDER (CUSTOM PROCEDURE TRAY) ×1 IMPLANT
PAD ARMBOARD 7.5X6 YLW CONV (MISCELLANEOUS) ×2 IMPLANT
PAD COLD SHLDR WRAP-ON (PAD) IMPLANT
PORT APPOLLO RF 90DEGREE MULTI (SURGICAL WAND) IMPLANT
RESTRAINT HEAD UNIVERSAL NS (MISCELLANEOUS) ×1 IMPLANT
SLING ARM IMMOBILIZER LRG (SOFTGOODS) IMPLANT
SOL PREP POV-IOD 4OZ 10% (MISCELLANEOUS) ×2 IMPLANT
SPONGE T-LAP 4X18 ~~LOC~~+RFID (SPONGE) ×1 IMPLANT
STRIP CLOSURE SKIN 1/2X4 (GAUZE/BANDAGES/DRESSINGS) IMPLANT
SUT ETHILON 3 0 PS 1 (SUTURE) IMPLANT
SUT MNCRL AB 3-0 PS2 18 (SUTURE) IMPLANT
SUT VIC AB 0 CT1 27 (SUTURE) ×2
SUT VIC AB 0 CT1 27XBRD ANBCTR (SUTURE) IMPLANT
SUT VIC AB 1 CT1 27 (SUTURE) ×2
SUT VIC AB 1 CT1 27XBRD ANBCTR (SUTURE) IMPLANT
SUT VIC AB 2-0 CT1 27 (SUTURE) ×1
SUT VIC AB 2-0 CT1 TAPERPNT 27 (SUTURE) IMPLANT
SUT VICRYL 0 UR6 27IN ABS (SUTURE) IMPLANT
SUT VICRYL 1 TIES 12X18 (SUTURE) IMPLANT
SYR 20ML LL LF (SYRINGE) ×2 IMPLANT
SYR 30ML LL (SYRINGE) ×1 IMPLANT
SYR TB 1ML LUER SLIP (SYRINGE) ×1 IMPLANT
SYS FBRTK BUTTON 2.6 (Anchor) ×1 IMPLANT
SYSTEM FBRTK BUTTON 2.6 (Anchor) IMPLANT
TOWEL GREEN STERILE (TOWEL DISPOSABLE) ×1 IMPLANT
TOWEL GREEN STERILE FF (TOWEL DISPOSABLE) ×1 IMPLANT
TUBING ARTHROSCOPY IRRIG 16FT (MISCELLANEOUS) ×1 IMPLANT
WATER STERILE IRR 1000ML POUR (IV SOLUTION) ×1 IMPLANT

## 2022-02-03 NOTE — Anesthesia Preprocedure Evaluation (Addendum)
Anesthesia Evaluation  Patient identified by MRN, date of birth, ID band Patient awake    Reviewed: Allergy & Precautions, NPO status , Patient's Chart, lab work & pertinent test results  Airway Mallampati: III  TM Distance: >3 FB Neck ROM: Full    Dental no notable dental hx. (+) Teeth Intact, Dental Advisory Given   Pulmonary Patient abstained from smoking., former smoker   Pulmonary exam normal breath sounds clear to auscultation       Cardiovascular negative cardio ROS Normal cardiovascular exam Rhythm:Regular Rate:Normal     Neuro/Psych  Headaches  negative psych ROS   GI/Hepatic negative GI ROS, Neg liver ROS,,,  Endo/Other  Obesity  Renal/GU negative Renal ROS  negative genitourinary   Musculoskeletal  (+) Arthritis ,  Right shoulder rotator cuff tear Tear right glenoid labrum Biceps tendon subluxation right shoulder   Abdominal  (+) + obese  Peds  Hematology  (+) Blood dyscrasia, anemia   Anesthesia Other Findings   Reproductive/Obstetrics                             Anesthesia Physical Anesthesia Plan  ASA: 2  Anesthesia Plan: General   Post-op Pain Management: Regional block* and Minimal or no pain anticipated   Induction: Intravenous  PONV Risk Score and Plan: 4 or greater and Treatment may vary due to age or medical condition, Midazolam, Ondansetron and Dexamethasone  Airway Management Planned: Oral ETT  Additional Equipment: None  Intra-op Plan:   Post-operative Plan: Extubation in OR  Informed Consent: I have reviewed the patients History and Physical, chart, labs and discussed the procedure including the risks, benefits and alternatives for the proposed anesthesia with the patient or authorized representative who has indicated his/her understanding and acceptance.     Dental advisory given  Plan Discussed with: Anesthesiologist and CRNA  Anesthesia Plan  Comments:        Anesthesia Quick Evaluation

## 2022-02-03 NOTE — Anesthesia Procedure Notes (Addendum)
Procedure Name: Intubation Date/Time: 02/03/2022 4:39 PM  Performed by: Rande Brunt, CRNAPre-anesthesia Checklist: Patient identified, Emergency Drugs available, Suction available and Patient being monitored Patient Re-evaluated:Patient Re-evaluated prior to induction Oxygen Delivery Method: Circle System Utilized Preoxygenation: Pre-oxygenation with 100% oxygen Induction Type: IV induction Ventilation: Mask ventilation without difficulty Laryngoscope Size: Mac and 3 Grade View: Grade I Tube type: Oral Tube size: 7.0 mm Number of attempts: 1 Airway Equipment and Method: Stylet and Oral airway Placement Confirmation: ETT inserted through vocal cords under direct vision, positive ETCO2 and breath sounds checked- equal and bilateral Secured at: 21 cm Tube secured with: Tape Dental Injury: Teeth and Oropharynx as per pre-operative assessment  Comments: ETT placed by Cathren Harsh CRNA

## 2022-02-03 NOTE — Anesthesia Procedure Notes (Addendum)
  Anesthesia Regional Block: Interscalene brachial plexus block   Pre-Anesthetic Checklist: , timeout performed,  Correct Patient, Correct Site, Correct Laterality,  Correct Procedure, Correct Position, site marked,  Risks and benefits discussed,  Surgical consent,  Pre-op evaluation,  At surgeon's request and post-op pain management  Laterality: Right  Prep: chloraprep       Needles:  Injection technique: Single-shot  Needle Type: Echogenic Stimulator Needle     Needle Length: 10cm  Needle Gauge: 21   Needle insertion depth: 7 cm   Additional Needles:   Procedures:,,,, ultrasound used (permanent image in chart),,    Narrative:  Start time: 02/03/2022 1:52 PM End time: 02/03/2022 1:57 PM Injection made incrementally with aspirations every 5 mL.  Performed by: Personally  Anesthesiologist: Josephine Igo, MD  Additional Notes: Timeout performed. Patient sedated. Relevant anatomy ID'd using [place]. Incremental 2-15ml injection of LA with frequent aspiration. Patient tolerated procedure well.
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
## 2022-02-03 NOTE — Transfer of Care (Signed)
Immediate Anesthesia Transfer of Care Note  Patient: Theresa Banks  Procedure(s) Performed: RIGHT SHOULDER ARTHROSCOPY, DEBRIDEMENT, MINI OPEN ROTATOR CUFF TEAR REPAIR (Right: Shoulder) BICEPS TENODESIS (Right: Shoulder)  Patient Location: PACU  Anesthesia Type:General  Level of Consciousness: awake, drowsy, and patient cooperative  Airway & Oxygen Therapy: Patient Spontanous Breathing  Post-op Assessment: Report given to RN, Post -op Vital signs reviewed and stable, and Patient moving all extremities X 4  Post vital signs: Reviewed and stable  Last Vitals:  Vitals Value Taken Time  BP 129/54 02/03/22 2048  Temp    Pulse 92 02/03/22 2050  Resp 16 02/03/22 2050  SpO2 92 % 02/03/22 2050  Vitals shown include unvalidated device data.  Last Pain:  Vitals:   02/03/22 1329  PainSc: 0-No pain         Complications: No notable events documented.

## 2022-02-03 NOTE — OR Nursing (Signed)
Assumed care of patient at 57.

## 2022-02-03 NOTE — Brief Op Note (Signed)
   02/03/2022  8:09 PM  PATIENT:  Theresa Banks  43 y.o. female  PRE-OPERATIVE DIAGNOSIS:  right shoulder rotator cuff tear, biceps subluxation  POST-OPERATIVE DIAGNOSIS:  right shoulder rotator cuff tear,infra supra and subscap, biceps subluxation  PROCEDURE:  Procedure(s): RIGHT SHOULDER ARTHROSCOPY, DEBRIDEMENT, MINI OPEN ROTATOR CUFF TEAR REPAIR of infra,supra and subscap BICEPS TENODESIS  SURGEON:  Surgeon(s): Marlou Sa, Tonna Corner, MD  ASSISTANT: magnant pa  ANESTHESIA:   general  EBL: 25 ml    No intake/output data recorded.  BLOOD ADMINISTERED: none  DRAINS: none   LOCAL MEDICATIONS USED:  marcaine mso4 clonidine  SPECIMEN:  No Specimen  COUNTS:  YES  TOURNIQUET:  * No tourniquets in log *  DICTATION: .Other Dictation: Dictation Number [id number]  PLAN OF CARE: Discharge to home after PACU  PATIENT DISPOSITION:  PACU - hemodynamically stable

## 2022-02-03 NOTE — Op Note (Signed)
NAME: Theresa Banks, Theresa Banks MEDICAL RECORD NO: [id number] ACCOUNT NO: [id number] DATE OF BIRTH: 1980-09-01 FACILITY: [facility] LOCATION: [facility] PHYSICIAN: Yetta Barre. Marlou Sa, MD  Operative Report   PREOPERATIVE DIAGNOSES:  Right shoulder rotator cuff tear, infraspinatus, supraspinatus, and subscapularis with medial dislocation of the biceps tendon.  POSTOPERATIVE DIAGNOSES:  Right shoulder rotator cuff tear, infraspinatus, supraspinatus, and subscapularis with medial dislocation of the biceps tendon.  PROCEDURE:  Right shoulder arthroscopy with debridement of the superior labrum, release of the biceps tendon, mini open biceps tenodesis with mini open rotator cuff tear repair of the supraspinatus, infraspinatus and subscapularis.  SURGEON:  Yetta Barre. Marlou Sa, MD  ASSISTANT:  Annie Main, PA.  INDICATIONS:  The patient is a 42 year old patient who dislocated his shoulder about 5 months ago.  She presents for operative management after explanation of risks and benefits.  MRI scan showed rotator cuff tearing, infraspinatus, supraspinatus, and  subscap with medial subluxation of the biceps tendon and labrum intact on MRI scan.  PROCEDURE IN DETAIL:  The patient was brought to the operating room where general endotracheal anesthesia was induced.  Preoperative antibiotics administered.  Timeout was called.  The patient was placed in the beach chair position with head in neutral  position.  Right shoulder, arm and hand prescrubbed with alcohol, which was allowed to dry, then prepped with ChloraPrep solution and draped in a sterile manner.  Charlie Pitter was used to seal the operative field and cover the axilla.  Next, after calling  timeout, posterior portal was created.  Anterior portal created under direct visualization.  Diagnostic arthroscopy was performed.  The patient had intact anterior inferior and posterior inferior glenohumeral ligaments.  It should also be noted that  examination under anesthesia  demonstrated range of motion of 60/100/180 with good stability anteriorly and posteriorly 1+ with less than a centimeter sulcus sign.  Then, anterior portal was created.  Arthroscopy demonstrated intact anterior inferior and  posterior inferior glenohumeral ligaments.  There was a tear of the subscap, but it was difficult to distinguish the tissue planes due to the chronicity of the injury.  Biceps tendon was medially subluxated. SLAP tear was present. Arthrocare wand used to  debride the superior labrum, released the biceps tendon.  Next, the infraspinatus and supraspinatus were also seen to be torn and retracted about 3 cm.  Instruments were removed.  Portals were closed using 3-0 nylon.  Ioban then used to cover the entire  operative field.  An incision made off the anterolateral margin of the acromion, 5 cm incision was made. The deltoid was split between the anterior middle raphae between the measured distance of 4.5 cm.  The bursa was then removed.  The biceps tendon  was visualized.  It still remained somewhat immediately subluxated in the joint, but we were able to retrieve it and placed it back into the bicipital groove.  The biceps tendon was then tenodesed using a 2.6 FiberTak button.  This was done at the  inferior aspect of the bicipital groove.  The subscapularis tendon was mobilized after opening up the transverse humeral ligament.  There was a tear, but it was not too retracted.  We were able to put 2 Vicryl tagging sutures and rotator interval was  opened.  This allowed the subscap to be mobilized laterally.  Next, the lesser tuberosity was prepared to bleeding bone and a medial row double loaded 2.6 self-punching FibeTtak was placed.  That was also supplemented inferiorly with a 1.8 knotless  FiberTak.  With the subscap  reduced, the inferior suture was placed in a mattress fashion to secure the capsule on the subscap.  The other sutures were placed into a SwiveLock along with the superior  biceps FiberLoop sutures used in the tenodesis. These  were all placed in the SwiveLock into the bicipital groove.  This gave excellent fixation of the subscapularis.  Attention was then directed towards the infraspinatus and supraspinatus.  A 6-0 Vicryl sutures were placed in the leading edge of the tendon.   Tendon was mobilized with release of the coracohumeral ligament.  We were able to reduce it back down to the greater tuberosity.  The tuberosity was prepared down to bleeding bone surface.  Next, 3 medial row double loaded 2.6 self-punching FiberTaks  were placed.  Then, the Vicryl suture was used to reduce the tendon back to its footprint.  The SutureTapes which had been passed using a Scorpion suture passer from posterior to anterior were then tied into 6 knots, the limbs of which were crossed.   Next, the Vicryl sutures were placed into a SwiveLock, which reduced the tendon in a watertight fashion.  The 2 sets of 6 SutureTapes were then placed and tacked down anterior and posterior to the Vicryl SwiveLock.  This gave a watertight repair.   Acromioplasty performed.  Rotator interval closed with a 0 Vicryl suture, and the arm in 30 degrees of external rotation.  All in all, a secure watertight repair was achieved of all 3 tendons.  Thorough irrigation was performed.  Vancomycin powder  placed.  Deltoid split which had been marked with #1 Vicryl suture was then closed using #1 Vicryl suture followed by interrupted inverted 0 Vicryl suture, 2-0 Vicryl suture, and 3-0 Monocryl with Steri-Strips applied and waterproof dressings applied.  A  sling applied.  Luke's assistance was required at all times for retraction, opening, closing, mobilization of tissue.  His assistance was a medical necessity.   NIK D: 02/03/2022 8:19:10 pm T: 02/03/2022 10:16:00 pm  JOB: [id number]/ [id number]

## 2022-02-04 ENCOUNTER — Encounter (HOSPITAL_COMMUNITY): Payer: Self-pay | Admitting: Orthopedic Surgery

## 2022-02-06 DIAGNOSIS — S46011A Strain of muscle(s) and tendon(s) of the rotator cuff of right shoulder, initial encounter: Secondary | ICD-10-CM

## 2022-02-06 DIAGNOSIS — S43003A Unspecified subluxation of unspecified shoulder joint, initial encounter: Secondary | ICD-10-CM

## 2022-02-07 NOTE — Anesthesia Postprocedure Evaluation (Signed)
Anesthesia Post Note  Patient: Theresa Banks  Procedure(s) Performed: RIGHT SHOULDER ARTHROSCOPY, DEBRIDEMENT, MINI OPEN ROTATOR CUFF TEAR REPAIR (Right: Shoulder) BICEPS TENODESIS (Right: Shoulder)     Patient location during evaluation: PACU Anesthesia Type: General Level of consciousness: awake and alert Pain management: pain level controlled Vital Signs Assessment: post-procedure vital signs reviewed and stable Respiratory status: spontaneous breathing, nonlabored ventilation, respiratory function stable and patient connected to nasal cannula oxygen Cardiovascular status: blood pressure returned to baseline and stable Postop Assessment: no apparent nausea or vomiting Anesthetic complications: no   No notable events documented.  Last Vitals:  Vitals:   02/03/22 2130 02/03/22 2145  BP: 129/83 127/87  Pulse: 88 87  Resp: 19 18  Temp:  36.7 C  SpO2: 96% 98%    Last Pain:  Vitals:   02/03/22 2145  PainSc: 0-No pain                 Tiajuana Amass

## 2022-02-11 ENCOUNTER — Ambulatory Visit (INDEPENDENT_AMBULATORY_CARE_PROVIDER_SITE_OTHER): Payer: Medicaid Other | Admitting: Surgical

## 2022-02-11 ENCOUNTER — Encounter: Payer: Self-pay | Admitting: Surgical

## 2022-02-11 DIAGNOSIS — Z9889 Other specified postprocedural states: Secondary | ICD-10-CM

## 2022-02-11 NOTE — Progress Notes (Signed)
Post-Op Visit Note   Patient: Theresa Banks           Date of Birth: 02-14-1980           MRN: [id number] Visit Date: 02/11/2022 PCP: Patient, No Pcp Per   Assessment & Plan:  Chief Complaint:  Chief Complaint  Patient presents with   Right Shoulder - Routine Post Op   Visit Diagnoses:  1. S/P right rotator cuff repair     Plan: Theresa Banks is a 42 y.o. female who presents s/p right shoulder massive rotator cuff repair of supraspinatus, infraspinatus and, subscapularis and biceps tenodesis on 02/03/2022.  Patient is doing well and pain is overall controlled.  She is using CPM machine..  Denies any chest pain, SOB, fevers, chills. Taking pain medication every 24 hours.  Main complaint is itching.  She has been overall compliant with lifting restriction but she does state that she has occasionally carried a plate of food in the operative hand  On exam, patient has range of motion 15 degrees external rotation, 45 degrees abduction, 90 degrees forward elevation.  Intact EPL, FPL, finger abduction, finger adduction, pronation/supination, bicep, tricep, deltoid of operative extremity.  Axillary nerve intact with deltoid firing.  Incisions are healing well without evidence of infection or dehiscence.  Sutures removed and replaced with Steri-Strips today.  2+ radial pulse of the operative extremity  Plan is continue no lifting with the operative arm and no active range of motion of the operative shoulder.  I strongly advised patient that any lifting or active motion of the shoulder could compromise her tendon repair.  She understands and she will only come out of her sling when she is using the CPM machine or showering.  Plan to follow-up in 3 weeks for clinical recheck with Dr. Marlou Sa and initiation of physical therapy at that time.  Call with any concerns in the meantime..   Follow-Up Instructions: No follow-ups on file.   Orders:  No orders of the defined types were  placed in this encounter.  No orders of the defined types were placed in this encounter.   Imaging: No results found.  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   Shoulder dislocation, right, initial encounter 09/08/2021   Indication for care in labor and delivery, antepartum 05/17/2016   Encounter for supervision of other normal pregnancy, third trimester 05/17/2016   Past Medical History:  Diagnosis Date   Anemia    Headache     Family History  Problem Relation Age of Onset   Birth defects Daughter        ASD reapired at 46 YO    Past Surgical History:  Procedure Laterality Date   BICEPT TENODESIS Right 02/03/2022   Procedure: BICEPS TENODESIS;  Surgeon: Meredith Pel, MD;  Location: [place];  Service: Orthopedics;  Laterality: Right;   HAND DEBRIDEMENT Right    SHOULDER ARTHROSCOPY WITH ROTATOR CUFF REPAIR AND SUBACROMIAL DECOMPRESSION Right 02/03/2022   Procedure: RIGHT SHOULDER ARTHROSCOPY, DEBRIDEMENT, MINI OPEN ROTATOR CUFF TEAR REPAIR;  Surgeon: Meredith Pel, MD;  Location: [place];  Service: Orthopedics;  Laterality: Right;   Social History   Occupational History   Not on file  Tobacco Use   Smoking status: Former  Packs/day: 0.50    Types: Cigarettes    Quit date: 04/23/2021    Years since quitting: 0.8   Smokeless tobacco: Never  Vaping Use   Vaping Use: Every day  Substance and Sexual Activity   Alcohol use: Yes    Comment: Maybe 1-2 drinks on weekends   Drug use: No   Sexual activity: Yes

## 2022-03-02 ENCOUNTER — Encounter: Payer: Self-pay | Admitting: Orthopedic Surgery

## 2022-03-02 ENCOUNTER — Ambulatory Visit (INDEPENDENT_AMBULATORY_CARE_PROVIDER_SITE_OTHER): Payer: Medicaid Other | Admitting: Orthopedic Surgery

## 2022-03-02 DIAGNOSIS — Z9889 Other specified postprocedural states: Secondary | ICD-10-CM

## 2022-03-02 NOTE — Progress Notes (Signed)
   Post-Op Visit Note   Patient: Theresa Banks           Date of Birth: 07/08/1980           MRN: [id number] Visit Date: 03/02/2022 PCP: Patient, No Pcp Per   Assessment & Plan:  Chief Complaint:  Chief Complaint  Patient presents with   Right Shoulder - Routine Post Op    02/03/22 (3w 6d)Right Shoulder Arthroscopy, Debridement, Mini Open Rotator Cuff Tear Repair Biceps Tenodesis - Right      Visit Diagnoses:  1. S/P right rotator cuff repair     Plan: Patient presents now about 4 weeks out right shoulder arthroscopy rotator cuff tear repair of infraspinatus supraspinatus and upper portion of subscap and biceps tenodesis.  Using her sling for range of motion exercises.  On examination she has about 20 degrees of external rotation and about 50 of abduction and 70 of forward flexion.  Rotator cuff strength is particularly weak.  No coarse grinding or crepitus with passive range of motion.  At this time would like to discontinue the sling but no lifting with that right arm.  Physical therapy here for passive range of motion plus deltoid isometrics for 2 weeks and then in 2 weeks which will be 6 weeks postop she is okay for strengthening status post massive rotator cuff tear repair.  That second phase of physical therapy should be 3 times a week for 4 weeks.  Follow-up in 4 weeks for clinical recheck  Follow-Up Instructions: No follow-ups on file.   Orders:  No orders of the defined types were placed in this encounter.  No orders of the defined types were placed in this encounter.   Imaging: No results found.  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   Shoulder dislocation, right, initial encounter 09/08/2021   Indication  for care in labor and delivery, antepartum 05/17/2016   Encounter for supervision of other normal pregnancy, third trimester 05/17/2016   Past Medical History:  Diagnosis Date   Anemia    Headache     Family History  Problem Relation Age of Onset   Birth defects Daughter        ASD reapired at 84 YO    Past Surgical History:  Procedure Laterality Date   BICEPT TENODESIS Right 02/03/2022   Procedure: BICEPS TENODESIS;  Surgeon: Meredith Pel, MD;  Location: [place];  Service: Orthopedics;  Laterality: Right;   HAND DEBRIDEMENT Right    SHOULDER ARTHROSCOPY WITH ROTATOR CUFF REPAIR AND SUBACROMIAL DECOMPRESSION Right 02/03/2022   Procedure: RIGHT SHOULDER ARTHROSCOPY, DEBRIDEMENT, MINI OPEN ROTATOR CUFF TEAR REPAIR;  Surgeon: Meredith Pel, MD;  Location: [place];  Service: Orthopedics;  Laterality: Right;   Social History   Occupational History   Not on file  Tobacco Use   Smoking status: Former    Packs/day: 0.50    Types: Cigarettes    Quit date: 04/23/2021    Years since quitting: 0.8   Smokeless tobacco: Never  Vaping Use   Vaping Use: Every day  Substance and Sexual Activity   Alcohol use: Yes    Comment: Maybe 1-2 drinks on weekends   Drug use: No   Sexual activity: Yes

## 2022-03-17 NOTE — Therapy (Deleted)
OUTPATIENT PHYSICAL THERAPY SHOULDER EVALUATION   Patient Name: Theresa Banks MRN: [id number] DOB:May 08, 1980, 42 y.o., female Today's Date: 03/17/2022  END OF SESSION:   Past Medical History:  Diagnosis Date   Anemia    Headache    Past Surgical History:  Procedure Laterality Date   BICEPT TENODESIS Right 02/03/2022   Procedure: BICEPS TENODESIS;  Surgeon: Meredith Pel, MD;  Location: [facility];  Service: Orthopedics;  Laterality: Right;   HAND DEBRIDEMENT Right    SHOULDER ARTHROSCOPY WITH ROTATOR CUFF REPAIR AND SUBACROMIAL DECOMPRESSION Right 02/03/2022   Procedure: RIGHT SHOULDER ARTHROSCOPY, DEBRIDEMENT, MINI OPEN ROTATOR CUFF TEAR REPAIR;  Surgeon: Meredith Pel, MD;  Location: [place];  Service: Orthopedics;  Laterality: Right;   Patient Active Problem List   Diagnosis Date Noted   Traumatic complete tear of right rotator cuff 02/06/2022   Subluxation of tendon of long head of biceps 02/06/2022   Labral tear of shoulder, right, sequela 11/16/2021   Adhesive capsulitis of right shoulder 11/16/2021   Shoulder dislocation, right, initial encounter 09/08/2021   Indication for care in labor and delivery, antepartum 05/17/2016   Encounter for supervision of other normal pregnancy, third trimester 05/17/2016    PCP: ***  REFERRING PROVIDER: ***  REFERRING DIAG: ***  THERAPY DIAG:  No diagnosis found.  Rationale for Evaluation and Treatment: {HABREHAB:27488}  ONSET DATE: ***  SUBJECTIVE:                                                                                                                                                                                      SUBJECTIVE STATEMENT: ***  PERTINENT HISTORY: Patient presents now about 4 weeks out right shoulder arthroscopy rotator cuff tear repair of infraspinatus supraspinatus and upper portion of subscap and biceps tenodesis.  Using her sling for range of motion exercises.  On examination she  has about 20 degrees of external rotation and about 50 of abduction and 70 of forward flexion.  Rotator cuff strength is particularly weak.  No coarse grinding or crepitus with passive range of motion.  At this time would like to discontinue the sling but no lifting with that right arm.  Physical therapy here for passive range of motion plus deltoid isometrics for 2 weeks and then in 2 weeks which will be 6 weeks postop she is okay for strengthening status post massive rotator cuff tear repair.  That second phase of physical therapy should be 3 times a week for 4 weeks.  Follow-up in 4 weeks for clinical recheck    PAIN:  Are you having pain? Yes: NPRS scale: ***/10 Pain location: *** Pain description: *** Aggravating factors: *** Relieving  factors: ***  PRECAUTIONS: Other: protocol   WEIGHT BEARING RESTRICTIONS: {Yes ***/No:24003}  FALLS:  Has patient fallen in last 6 months? {fallsyesno:27318}  LIVING ENVIRONMENT: Lives with: {[facility] lives with:25569::"lives with their family"} Lives in: {Lives in:25570} Stairs: {opstairs:27293} Has following equipment at home: {Assistive devices:23999}  OCCUPATION: ***  PLOF: {PLOF:24004}  PATIENT GOALS:***  NEXT MD VISIT:   OBJECTIVE:   DIAGNOSTIC FINDINGS:  ***  PATIENT SURVEYS:  {rehab surveys:24030:a}  COGNITION: Overall cognitive status: {cognition:24006}     SENSATION: {sensation:27233}  POSTURE: ***  UPPER EXTREMITY ROM:   {AROM/PROM:27142} ROM Right eval Left eval  Shoulder flexion    Shoulder extension    Shoulder abduction    Shoulder adduction    Shoulder internal rotation    Shoulder external rotation    Elbow flexion    Elbow extension    Wrist flexion    Wrist extension    Wrist ulnar deviation    Wrist radial deviation    Wrist pronation    Wrist supination    (Blank rows = not tested)  UPPER EXTREMITY MMT:  MMT Right eval Left eval  Shoulder flexion    Shoulder extension    Shoulder  abduction    Shoulder adduction    Shoulder internal rotation    Shoulder external rotation    Middle trapezius    Lower trapezius    Elbow flexion    Elbow extension    Wrist flexion    Wrist extension    Wrist ulnar deviation    Wrist radial deviation    Wrist pronation    Wrist supination    Grip strength (lbs)    (Blank rows = not tested)  SHOULDER SPECIAL TESTS: Impingement tests: {shoulder impingement test:25231:a} SLAP lesions: {SLAP lesions:25232} Instability tests: {shoulder instability test:25233} Rotator cuff assessment: {rotator cuff assessment:25234} Biceps assessment: {biceps assessment:25235}  JOINT MOBILITY TESTING:  ***  PALPATION:  ***   TODAY'S TREATMENT:                                                                                                                                         DATE: ***   PATIENT EDUCATION: Education details: *** Person educated: {Person educated:25204} Education method: {Education Method:25205} Education comprehension: {Education Comprehension:25206}  HOME EXERCISE PROGRAM: ***  ASSESSMENT:  CLINICAL IMPRESSION: Patient is a *** y.o. *** who was seen today for physical therapy evaluation and treatment for ***.   OBJECTIVE IMPAIRMENTS: {opptimpairments:25111}.   ACTIVITY LIMITATIONS: {activitylimitations:27494}  PARTICIPATION LIMITATIONS: {participationrestrictions:25113}  PERSONAL FACTORS: {Personal factors:25162} are also affecting patient's functional outcome.   REHAB POTENTIAL: {rehabpotential:25112}  CLINICAL DECISION MAKING: {clinical decision making:25114}  EVALUATION COMPLEXITY: {Evaluation complexity:25115}   GOALS: Goals reviewed with patient? {yes/no:20286}  SHORT TERM GOALS: Target date: ***  *** Baseline: Goal status: {GOALSTATUS:25110}  2.  *** Baseline:  Goal status: {GOALSTATUS:25110}  3.  *** Baseline:  Goal status: {GOALSTATUS:25110}  4.  ***  Baseline:  Goal status:  {GOALSTATUS:25110}  5.  *** Baseline:  Goal status: {GOALSTATUS:25110}  6.  *** Baseline:  Goal status: {GOALSTATUS:25110}  LONG TERM GOALS: Target date: ***  *** Baseline:  Goal status: {GOALSTATUS:25110}  2.  *** Baseline:  Goal status: {GOALSTATUS:25110}  3.  *** Baseline:  Goal status: {GOALSTATUS:25110}  4.  *** Baseline:  Goal status: {GOALSTATUS:25110}  5.  *** Baseline:  Goal status: {GOALSTATUS:25110}  6.  *** Baseline:  Goal status: {GOALSTATUS:25110}  PLAN:  PT FREQUENCY: {rehab frequency:25116}  PT DURATION: {rehab duration:25117}  PLANNED INTERVENTIONS: {rehab planned interventions:25118::"Therapeutic exercises","Therapeutic activity","Neuromuscular re-education","Balance training","Gait training","Patient/Family education","Self Care","Joint mobilization"}  PLAN FOR NEXT SESSION: ***   Hilarie Sinha, PT 03/17/2022, 10:07 AM

## 2022-03-18 ENCOUNTER — Ambulatory Visit: Payer: Medicaid Other | Admitting: Physical Therapy

## 2022-03-21 ENCOUNTER — Ambulatory Visit: Payer: Medicaid Other | Attending: Orthopedic Surgery | Admitting: Physical Therapy

## 2022-03-21 DIAGNOSIS — M25619 Stiffness of unspecified shoulder, not elsewhere classified: Secondary | ICD-10-CM | POA: Insufficient documentation

## 2022-03-21 DIAGNOSIS — M6281 Muscle weakness (generalized): Secondary | ICD-10-CM | POA: Insufficient documentation

## 2022-03-21 DIAGNOSIS — Z9889 Other specified postprocedural states: Secondary | ICD-10-CM | POA: Diagnosis not present

## 2022-03-21 DIAGNOSIS — G8929 Other chronic pain: Secondary | ICD-10-CM | POA: Insufficient documentation

## 2022-03-21 DIAGNOSIS — M25511 Pain in right shoulder: Secondary | ICD-10-CM | POA: Insufficient documentation

## 2022-03-21 DIAGNOSIS — S43014A Anterior dislocation of right humerus, initial encounter: Secondary | ICD-10-CM | POA: Insufficient documentation

## 2022-03-21 NOTE — Therapy (Unsigned)
OUTPATIENT PHYSICAL THERAPY SHOULDER EVALUATION   Patient Name: Theresa Banks MRN: [id number] DOB:06-Sep-1980, 42 y.o., female Today's Date: 03/21/2022  END OF SESSION:   Past Medical History:  Diagnosis Date   Anemia    Headache    Past Surgical History:  Procedure Laterality Date   BICEPT TENODESIS Right 02/03/2022   Procedure: BICEPS TENODESIS;  Surgeon: Meredith Pel, MD;  Location: [place];  Service: Orthopedics;  Laterality: Right;   HAND DEBRIDEMENT Right    SHOULDER ARTHROSCOPY WITH ROTATOR CUFF REPAIR AND SUBACROMIAL DECOMPRESSION Right 02/03/2022   Procedure: RIGHT SHOULDER ARTHROSCOPY, DEBRIDEMENT, MINI OPEN ROTATOR CUFF TEAR REPAIR;  Surgeon: Meredith Pel, MD;  Location: [place] [facility];  Service: Orthopedics;  Laterality: Right;   Patient Active Problem List   Diagnosis Date Noted   Traumatic complete tear of right rotator cuff 02/06/2022   Subluxation of tendon of long head of biceps 02/06/2022   Labral tear of shoulder, right, sequela 11/16/2021   Adhesive capsulitis of right shoulder 11/16/2021   Shoulder dislocation, right, initial encounter 09/08/2021   Indication for care in labor and delivery, antepartum 05/17/2016   Encounter for supervision of other normal pregnancy, third trimester 05/17/2016    PCP: ***  REFERRING PROVIDER: ***  REFERRING DIAG: ***  THERAPY DIAG:  No diagnosis found.  Rationale for Evaluation and Treatment: Rehabilitation  ONSET DATE: February 03, 2022  SUBJECTIVE:                                                                                                                                                                                      SUBJECTIVE STATEMENT: Patient had shoulder surgery. States it doesn't hurt as much as it did before. Sometimes she gets burning sensation around the incision and the shoulder feels like it needs to pop when it gets stiff.  PERTINENT HISTORY: ***  PAIN:  Are you having  pain? No:  NPRS scale: 0/10 Pain location: right shoulder Pain description: Burning, stiff Aggravating factors: *** Relieving factors: ***  PRECAUTIONS: Shoulder  WEIGHT BEARING RESTRICTIONS: Yes  FALLS:  Has patient fallen in last 6 months? 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  PLOF: Independent  PATIENT GOALS:Use the right arm like normal   OBJECTIVE:  PATIENT SURVEYS:  Quick Dash 34.1% disability  COGNITION: Overall cognitive status: Within functional limits for tasks assessed     SENSATION: WFL  POSTURE: Rounded shoulder posture  UPPER EXTREMITY ROM:   Active ROM Right eval Left eval  Shoulder flexion 90 AROM 110 PROM 165 AROM  Shoulder extension    Shoulder abduction    Shoulder adduction    Shoulder internal  rotation    Shoulder external rotation 40 PROM   Elbow flexion    Elbow extension    Wrist flexion    Wrist extension    Wrist ulnar deviation    Wrist radial deviation    Wrist pronation    Wrist supination    (Blank rows = not tested)  Patient demonstrates significant shrug with active shoulder elevation  UPPER EXTREMITY MMT:  MMT Right eval Left eval  Shoulder flexion    Shoulder extension    Shoulder abduction    Shoulder adduction    Shoulder internal rotation    Shoulder external rotation    Middle trapezius    Lower trapezius    Elbow flexion    Elbow extension    Wrist flexion    Wrist extension    Wrist ulnar deviation    Wrist radial deviation    Wrist pronation    Wrist supination    Grip strength (lbs)    (Blank rows = not tested)  JOINT MOBILITY TESTING:  Not assessed  PALPATION:  Non tender to palpation    TODAY'S TREATMENT:       [facility] Adult PT Treatment:                                                DATE: 03/21/2022 Therapeutic Exercise: Supine dowel press x 10 Supine dowel shoulder flexion x 10 Supine dowel shoulder ER x 10 Sidelying ER x 10 Seated table slide x  5 Doorway shoulder ER stretch x 5  PATIENT EDUCATION: Education details: Exam findings, POC, HEP Person educated: Patient Education method: Explanation, Demonstration, Tactile cues, Verbal cues, and Handouts Education comprehension: verbalized understanding, returned demonstration, verbal cues required, tactile cues required, and needs further education  HOME EXERCISE PROGRAM: Access Code: [email or web address]    ASSESSMENT: CLINICAL IMPRESSION: Patient is a 42 y.o. female who was seen today for physical therapy evaluation and treatment for right shoulder pain following s/p right rotator cuff repair on 02/03/2022.   OBJECTIVE IMPAIRMENTS: {opptimpairments:25111}.   ACTIVITY LIMITATIONS: {activitylimitations:27494}  PARTICIPATION LIMITATIONS: {participationrestrictions:25113}  PERSONAL FACTORS: {Personal factors:25162} are also affecting patient's functional outcome.   REHAB POTENTIAL: Good  CLINICAL DECISION MAKING: Stable/uncomplicated  EVALUATION COMPLEXITY: Low   GOALS: Goals reviewed with patient? Yes  SHORT TERM GOALS: Target date: 04/18/2022  Patient will be I with initial HEP in order to progress with therapy. Baseline: HEP provided at eval Goal status: INITIAL  2.  *** Baseline:  Goal status: INITIAL  3.  *** Baseline:  Goal status: INITIAL  LONG TERM GOALS: Target date: 05/16/2022  Patient will be I with final HEP to maintain progress from PT. Baseline: HEP provided at eval Goal status: INITIAL  2.  Patient will report QuickDASH </= 10% disability in order to indicate improved functional ability using the right arm Baseline: 34.1% disability Goal status: INITIAL  3.  *** Baseline:  Goal status: INITIAL  4.  *** Baseline:  Goal status: INITIAL   PLAN: PT FREQUENCY: {rehab frequency:25116}  PT DURATION: {rehab duration:25117}  PLANNED INTERVENTIONS: {rehab planned interventions:25118::"Therapeutic exercises","Therapeutic activity","Neuromuscular  re-education","Balance training","Gait training","Patient/Family education","Self Care","Joint mobilization"}  PLAN FOR NEXT SESSION: ***   Hilda Blades, PT, DPT, LAT, ATC 03/21/22  3:41 PM Phone: [phone] Fax: [phone]

## 2022-03-21 NOTE — Patient Instructions (Signed)
Access Code: [email or web address] URL: [email or web address] Date: 03/21/2022 Prepared by: Hilda Blades  Exercises - Supine Shoulder Flexion Extension AAROM with Dowel  - 2 x daily - 2 sets - 10 reps - Supine Shoulder External Rotation with Dowel  - 2 x daily - 10 reps - 5 seconds hold - Sidelying Shoulder External Rotation  - 2 x daily - 2 sets - 10 reps - Seated Shoulder Flexion Towel Slide at Table Top  - 2 x daily - 10 reps - 10 seconds hold - Standing Shoulder External Rotation Stretch in Doorway  - 2 x daily - 10 reps - 10 seconds hold

## 2022-03-22 ENCOUNTER — Other Ambulatory Visit: Payer: Self-pay

## 2022-03-22 ENCOUNTER — Encounter: Payer: Self-pay | Admitting: Physical Therapy

## 2022-03-23 ENCOUNTER — Telehealth: Payer: Self-pay

## 2022-03-23 ENCOUNTER — Ambulatory Visit: Payer: Medicaid Other

## 2022-03-23 NOTE — Telephone Encounter (Signed)
Unable to LVM regarding missed PT appointment. Will send MyChart message.   Gwendolyn Grant, PT, DPT, ATC 03/23/22 2:21 PM

## 2022-03-29 ENCOUNTER — Ambulatory Visit: Payer: Medicaid Other | Admitting: Physical Therapy

## 2022-03-29 ENCOUNTER — Encounter: Payer: Self-pay | Admitting: Physical Therapy

## 2022-03-29 DIAGNOSIS — M6281 Muscle weakness (generalized): Secondary | ICD-10-CM

## 2022-03-29 DIAGNOSIS — M25619 Stiffness of unspecified shoulder, not elsewhere classified: Secondary | ICD-10-CM

## 2022-03-29 DIAGNOSIS — G8929 Other chronic pain: Secondary | ICD-10-CM | POA: Diagnosis not present

## 2022-03-29 DIAGNOSIS — M25511 Pain in right shoulder: Secondary | ICD-10-CM | POA: Diagnosis not present

## 2022-03-29 DIAGNOSIS — S43014A Anterior dislocation of right humerus, initial encounter: Secondary | ICD-10-CM

## 2022-03-29 DIAGNOSIS — Z9889 Other specified postprocedural states: Secondary | ICD-10-CM | POA: Diagnosis not present

## 2022-03-29 NOTE — Therapy (Signed)
OUTPATIENT PHYSICAL THERAPY TREATMENT NOTE   Patient Name: Theresa Banks MRN: [id number] DOB:[birth year], 42 y.o., female Today's Date: 03/29/2022  PCP: NA REFERRING PROVIDER: Dr. Marlou Sa  END OF SESSION:   PT End of Session - 03/29/22 1103     Visit Number 2    Number of Visits 17    Date for PT Re-Evaluation 05/16/22    Authorization Type [employer]    Authorization Time Period 03/23/22- 06/20/22    Authorization - Visit Number 1    Authorization - Number of Visits 11    PT Start Time 1105    PT Stop Time 1155    PT Time Calculation (min) 50 min    Activity Tolerance Patient tolerated treatment well    Behavior During Therapy [facility] for tasks assessed/performed             Past Medical History:  Diagnosis Date   Anemia    Headache    Past Surgical History:  Procedure Laterality Date   BICEPT TENODESIS Right 02/03/2022   Procedure: BICEPS TENODESIS;  Surgeon: Meredith Pel, MD;  Location: [place];  Service: Orthopedics;  Laterality: Right;   HAND DEBRIDEMENT Right    SHOULDER ARTHROSCOPY WITH ROTATOR CUFF REPAIR AND SUBACROMIAL DECOMPRESSION Right 02/03/2022   Procedure: RIGHT SHOULDER ARTHROSCOPY, DEBRIDEMENT, MINI OPEN ROTATOR CUFF TEAR REPAIR;  Surgeon: Meredith Pel, MD;  Location: [place];  Service: Orthopedics;  Laterality: Right;   Patient Active Problem List   Diagnosis Date Noted   Traumatic complete tear of right rotator cuff 02/06/2022   Subluxation of tendon of long head of biceps 02/06/2022   Labral tear of shoulder, right, sequela 11/16/2021   Adhesive capsulitis of right shoulder 11/16/2021   Shoulder dislocation, right, initial encounter 09/08/2021   Indication for care in labor and delivery, antepartum 05/17/2016   Encounter for supervision of other normal pregnancy, third trimester 05/17/2016    REFERRING DIAG: Z98.890 (ICD-10-CM) - S/P right rotator cuff repair  THERAPY DIAG:  Chronic right shoulder pain  Muscle weakness  (generalized)  Acute pain of right shoulder  Anterior dislocation of right shoulder, initial encounter  Limited range of motion (ROM) of shoulder  Rationale for Evaluation and Treatment Rehabilitation  PERTINENT HISTORY: see above   PRECAUTIONS: avoid strength until 12 weeks   SUBJECTIVE:                                                                                                                                                                                      SUBJECTIVE STATEMENT:  I feel a burning in my arm when I move it. I have been doing the exercises, it  is getting better.    PAIN:  Are you having pain? Yes: NPRS scale: 0-1/10 Pain location: Rt shoulder  Pain description: sore , burning  Aggravating factors: using it  Relieving factors: resting, cool    OBJECTIVE: (objective measures completed at initial evaluation unless otherwise dated)  PATIENT SURVEYS:  Quick Dash 34.1% disability   COGNITION: Overall cognitive status: Within functional limits for tasks assessed                                  SENSATION: WFL   POSTURE: Rounded shoulder posture   UPPER EXTREMITY ROM:    Active ROM Right eval Left eval  Shoulder flexion 90 AROM 110 PROM 165 AROM  Shoulder extension      Shoulder abduction 45 AROM 70 PROM    Shoulder adduction      Shoulder internal rotation      Shoulder external rotation 40 PROM    Elbow flexion      Elbow extension      Wrist flexion      Wrist extension      Wrist ulnar deviation      Wrist radial deviation      Wrist pronation      Wrist supination      (Blank rows = not tested)   Patient demonstrates significant shrug with active shoulder elevation   UPPER EXTREMITY MMT:   MMT Right eval Left eval  Shoulder flexion      Shoulder extension      Shoulder abduction      Shoulder adduction      Shoulder internal rotation      Shoulder external rotation      Middle trapezius      Lower trapezius      Elbow  flexion      Elbow extension      Wrist flexion      Wrist extension      Wrist ulnar deviation      Wrist radial deviation      Wrist pronation      Wrist supination      Grip strength (lbs)      (Blank rows = not tested)   JOINT MOBILITY TESTING:  Not assessed   PALPATION:  Non tender to palpation               TODAY'S TREATMENT:          [facility] Adult PT Treatment:                                                DATE: 03/29/22 Therapeutic Exercise: Supine dowel press x 15 Supine dowel shoulder flexion x 15 Supine dowel shoulder ER x 15 Wall slide x 15 , flexion and scaption  Pendulum forward and back  Doorway shoulder ER stretch x 5 UE ranger for flexion and adduction, slight abduction standing Seated AROM ER with elbow propped Deltoid isometrics x 10  Table stretch for ER 10 x 10 sec  Manual Therapy: PROM all planes  Inf glides G-H joint seated Rt UE propped at 65 deg   Modalities Cold pack 6 min R UE   [facility] Adult PT Treatment:  DATE: 03/21/2022 Therapeutic Exercise: Supine dowel press x 10 Supine dowel shoulder flexion x 10 Supine dowel shoulder ER x 10 Sidelying ER x 10 Seated table slide x 5 Doorway shoulder ER stretch x 5   PATIENT EDUCATION: Education details: Exam findings, POC, HEP Person educated: Patient Education method: Explanation, Demonstration, Tactile cues, Verbal cues, and Handouts Education comprehension: verbalized understanding, returned demonstration, verbal cues required, tactile cues required, and needs further education   HOME EXERCISE PROGRAM: Access Code: [email or web address]      ASSESSMENT: CLINICAL IMPRESSION: Patient tolerated session focused on active assisted range of motion in multiple positions.  Pain with end range shoulder flexion, abduction increases to about 6/10.  She has begun to use her right arm at home for basic ADLs and light housework.  Advised that she could use cold pack or ice  after her home exercise program to reduce inflammation and calm the pain.   OBJECTIVE IMPAIRMENTS: decreased activity tolerance, decreased ROM, decreased strength, postural dysfunction, and pain.    ACTIVITY LIMITATIONS: carrying, lifting, bathing, dressing, reach over head, and hygiene/grooming   PARTICIPATION LIMITATIONS: meal prep, cleaning, driving, shopping, and occupation   PERSONAL FACTORS: Fitness, Past/current experiences, and Time since onset of injury/illness/exacerbation are also affecting patient's functional outcome.    REHAB POTENTIAL: Good   CLINICAL DECISION MAKING: Stable/uncomplicated   EVALUATION COMPLEXITY: Low     GOALS: Goals reviewed with patient? Yes   SHORT TERM GOALS: Target date: 04/18/2022   Patient will be I with initial HEP in order to progress with therapy. Baseline: HEP provided at eval Goal status: INITIAL   2.  Patient will demonstrate right shoulder PROM grossly WFL in order to indicate improved right shoulder mobility Baseline: patient demonstrates limitations with right shoulder PROM in all planes (see above) Goal status: INITIAL   3.  Patient will demonstrate right shoulder AROM elevation to 90 deg without shrug compensation to improve shoulder mechanics with raising arm and reduce shoulder pain with bathing and dressing  Baseline: patient demonstrates significant shrug with active shoulder elevation Goal status: INITIAL   LONG TERM GOALS: Target date: 05/16/2022   Patient will be I with final HEP to maintain progress from PT. Baseline: HEP provided at eval Goal status: INITIAL   2.  Patient will report QuickDASH </= 10% disability in order to indicate improved functional ability using the right arm Baseline: 34.1% disability Goal status: INITIAL   3.  Patient will demonstrate right shoulder AROM grossly WFL in order to normalize overhead reach and allow for ability to perform light household tasks without limitation  Baseline: patient  demonstrates limitations in right shoulder AROM in all planes (see above) Goal status: INITIAL   4.  Patient will demonstrate right rotator cuff strength >/= 4/5 MMT in order to improve lifting, carrying, and performing heavier household tasks without pain or limitation Baseline: not assessed at eval Goal status: INITIAL     PLAN: PT FREQUENCY: 1-2x/week   PT DURATION: 8 weeks   PLANNED INTERVENTIONS: Therapeutic exercises, Therapeutic activity, Neuromuscular re-education, Balance training, Gait training, Patient/Family education, Self Care, Joint mobilization, Joint manipulation, Aquatic Therapy, Dry Needling, Spinal manipulation, Spinal mobilization, Cryotherapy, Moist heat, Taping, Manual therapy, and Re-evaluation   PLAN FOR NEXT SESSION: Review HEP and progress PRN, manual/PROM for right shoulder motion, AAROM for all planes of shoulder motion, progress AROM supine/inclined/upright for elevation      Khalifa Knecht, PT 03/29/2022, 12:30 PM    Raeford Razor, PT 03/29/22 12:32 PM Phone: [phone] Fax: [phone]

## 2022-04-01 ENCOUNTER — Other Ambulatory Visit: Payer: Self-pay

## 2022-04-01 ENCOUNTER — Encounter: Payer: Self-pay | Admitting: Physical Therapy

## 2022-04-01 ENCOUNTER — Ambulatory Visit: Payer: Medicaid Other | Admitting: Physical Therapy

## 2022-04-01 DIAGNOSIS — M25619 Stiffness of unspecified shoulder, not elsewhere classified: Secondary | ICD-10-CM | POA: Diagnosis not present

## 2022-04-01 DIAGNOSIS — Z9889 Other specified postprocedural states: Secondary | ICD-10-CM | POA: Diagnosis not present

## 2022-04-01 DIAGNOSIS — M6281 Muscle weakness (generalized): Secondary | ICD-10-CM | POA: Diagnosis not present

## 2022-04-01 DIAGNOSIS — G8929 Other chronic pain: Secondary | ICD-10-CM | POA: Diagnosis not present

## 2022-04-01 DIAGNOSIS — S43014A Anterior dislocation of right humerus, initial encounter: Secondary | ICD-10-CM | POA: Diagnosis not present

## 2022-04-01 DIAGNOSIS — M25511 Pain in right shoulder: Secondary | ICD-10-CM | POA: Diagnosis not present

## 2022-04-01 NOTE — Therapy (Signed)
OUTPATIENT PHYSICAL THERAPY TREATMENT NOTE   Patient Name: Theresa Banks MRN: [id number] DOB:[birth year], 42 y.o., female Today's Date: 04/01/2022  PCP: None listed REFERRING PROVIDER: Meredith Pel, MD   END OF SESSION:   PT End of Session - 04/01/22 1411     Visit Number 3    Number of Visits 17    Date for PT Re-Evaluation 05/16/22    Authorization Type [employer]    Authorization Time Period 03/23/22 - 06/20/22    Authorization - Visit Number 2    Authorization - Number of Visits 11    PT Start Time [id number]    PT Stop Time 1445    PT Time Calculation (min) 42 min    Activity Tolerance Patient tolerated treatment well    Behavior During Therapy [facility] for tasks assessed/performed              Past Medical History:  Diagnosis Date   Anemia    Headache    Past Surgical History:  Procedure Laterality Date   BICEPT TENODESIS Right 02/03/2022   Procedure: BICEPS TENODESIS;  Surgeon: Meredith Pel, MD;  Location: [place];  Service: Orthopedics;  Laterality: Right;   HAND DEBRIDEMENT Right    SHOULDER ARTHROSCOPY WITH ROTATOR CUFF REPAIR AND SUBACROMIAL DECOMPRESSION Right 02/03/2022   Procedure: RIGHT SHOULDER ARTHROSCOPY, DEBRIDEMENT, MINI OPEN ROTATOR CUFF TEAR REPAIR;  Surgeon: Meredith Pel, MD;  Location: [place];  Service: Orthopedics;  Laterality: Right;   Patient Active Problem List   Diagnosis Date Noted   Traumatic complete tear of right rotator cuff 02/06/2022   Subluxation of tendon of long head of biceps 02/06/2022   Labral tear of shoulder, right, sequela 11/16/2021   Adhesive capsulitis of right shoulder 11/16/2021   Shoulder dislocation, right, initial encounter 09/08/2021   Indication for care in labor and delivery, antepartum 05/17/2016   Encounter for supervision of other normal pregnancy, third trimester 05/17/2016    REFERRING DIAG: S/P right rotator cuff repair  THERAPY DIAG:  Chronic right shoulder pain  Muscle  weakness (generalized)  Rationale for Evaluation and Treatment Rehabilitation  PERTINENT HISTORY: See PMH above   PRECAUTIONS: Shoulder    SUBJECTIVE:                                                                                                                                                                                     SUBJECTIVE STATEMENT:  States she has burning where the shoulder was but at when she moves it.   PAIN:  Are you having pain? No:  NPRS scale: 0/10 Pain location: Right shoulder Pain description: Burning, stiff Aggravating factors: Moving right shoulder,  overhead reach Relieving factors: Rest   OBJECTIVE: (objective measures completed at initial evaluation unless otherwise dated) PATIENT SURVEYS:  Quick Dash 34.1% disability  POSTURE: Rounded shoulder posture   UPPER EXTREMITY ROM:    Active ROM Right eval Left eval Rt 04/01/22  Shoulder flexion 90 AROM 110 PROM 165 AROM 130 PROM  Shoulder extension       Shoulder abduction 45 AROM 70 PROM     Shoulder adduction       Shoulder internal rotation       Shoulder external rotation 40 PROM     Elbow flexion       Elbow extension       Wrist flexion       Wrist extension       Wrist ulnar deviation       Wrist radial deviation       Wrist pronation       Wrist supination       (Blank rows = not tested)   Patient demonstrates significant shrug with active shoulder elevation   UPPER EXTREMITY MMT:   MMT Right eval Left eval  Shoulder flexion      Shoulder extension      Shoulder abduction      Shoulder adduction      Shoulder internal rotation      Shoulder external rotation      Middle trapezius      Lower trapezius      Elbow flexion      Elbow extension      Wrist flexion      Wrist extension      Wrist ulnar deviation      Wrist radial deviation      Wrist pronation      Wrist supination      Grip strength (lbs)      (Blank rows = not tested)   JOINT MOBILITY TESTING:   Not assessed   PALPATION:  Non tender to palpation               TODAY'S TREATMENT:       [facility] Adult PT Treatment:                                                DATE: 04/01/22 Therapeutic Exercise: Supine dowel press x 10 Supine dowel shoulder flexion 2 x 10 Sidelying shoulder abduction 2 x 10 Sidelying shoulder ER 2 x 10 Incline 45 deg shoulder flexion 2 x 10 Wall slide x 10 - partial range to avoid shrug Manual Therapy: Right GHJ mobs at various ranges directed at inferior and posterior directions Right shoulder PROM all directions    [facility] Adult PT Treatment:                                                DATE: 03/29/22 Therapeutic Exercise: Supine dowel press x 15 Supine dowel shoulder flexion x 15 Supine dowel shoulder ER x 15 Wall slide x 15 , flexion and scaption  Pendulum forward and back  Doorway shoulder ER stretch x 5 UE ranger for flexion and adduction, slight abduction standing Seated AROM ER with elbow propped Deltoid isometrics x 10  Table stretch for ER 10 x  10 sec  Manual Therapy: PROM all planes  Inf glides G-H joint seated Rt UE propped at 65 deg  Modalities Cold pack 6 min R UE   [facility] Adult PT Treatment:                                                DATE: 03/21/2022 Therapeutic Exercise: Supine dowel press x 10 Supine dowel shoulder flexion x 10 Supine dowel shoulder ER x 10 Sidelying ER x 10 Seated table slide x 5 Doorway shoulder ER stretch x 5   PATIENT EDUCATION: Education details: HEP update Person educated: Patient Education method: Explanation, Demonstration, Tactile cues, Verbal cues, and Handouts Education comprehension: verbalized understanding, returned demonstration, verbal cues required, tactile cues required, and needs further education   HOME EXERCISE PROGRAM: Access Code: [email or web address]      ASSESSMENT: CLINICAL IMPRESSION: Patient tolerated therapy well with no adverse effects. Therapy continues to focus on improve shoulder  mobility and progressing her active motion. She does demonstrate improved passive elevation this visit and able to progress with AAROM>AROM with good tolerance. She does exhibit scapular shrug with elevation but able to correct when cued, and she was instructed to avoid pushing into higher elevations with shrug. Patient would benefit from continued skilled PT to progress her mobility and active motion to maximize her functional ability.    OBJECTIVE IMPAIRMENTS: decreased activity tolerance, decreased ROM, decreased strength, postural dysfunction, and pain.    ACTIVITY LIMITATIONS: carrying, lifting, bathing, dressing, reach over head, and hygiene/grooming   PARTICIPATION LIMITATIONS: meal prep, cleaning, driving, shopping, and occupation   PERSONAL FACTORS: Fitness, Past/current experiences, and Time since onset of injury/illness/exacerbation are also affecting patient's functional outcome.      GOALS: Goals reviewed with patient? Yes   SHORT TERM GOALS: Target date: 04/18/2022   Patient will be I with initial HEP in order to progress with therapy. Baseline: HEP provided at eval Goal status: INITIAL   2.  Patient will demonstrate right shoulder PROM grossly WFL in order to indicate improved right shoulder mobility Baseline: patient demonstrates limitations with right shoulder PROM in all planes (see above) Goal status: INITIAL   3.  Patient will demonstrate right shoulder AROM elevation to 90 deg without shrug compensation to improve shoulder mechanics with raising arm and reduce shoulder pain with bathing and dressing  Baseline: patient demonstrates significant shrug with active shoulder elevation Goal status: INITIAL   LONG TERM GOALS: Target date: 05/16/2022   Patient will be I with final HEP to maintain progress from PT. Baseline: HEP provided at eval Goal status: INITIAL   2.  Patient will report QuickDASH </= 10% disability in order to indicate improved functional ability using  the right arm Baseline: 34.1% disability Goal status: INITIAL   3.  Patient will demonstrate right shoulder AROM grossly WFL in order to normalize overhead reach and allow for ability to perform light household tasks without limitation  Baseline: patient demonstrates limitations in right shoulder AROM in all planes (see above) Goal status: INITIAL   4.  Patient will demonstrate right rotator cuff strength >/= 4/5 MMT in order to improve lifting, carrying, and performing heavier household tasks without pain or limitation Baseline: not assessed at eval Goal status: INITIAL     PLAN: PT FREQUENCY: 1-2x/week   PT DURATION: 8 weeks   PLANNED INTERVENTIONS: Therapeutic exercises, Therapeutic activity,  Neuromuscular re-education, Balance training, Gait training, Patient/Family education, Self Care, Joint mobilization, Joint manipulation, Aquatic Therapy, Dry Needling, Spinal manipulation, Spinal mobilization, Cryotherapy, Moist heat, Taping, Manual therapy, and Re-evaluation   PLAN FOR NEXT SESSION: Review HEP and progress PRN, manual/PROM for right shoulder motion, AAROM for all planes of shoulder motion, progress AROM supine/inclined/upright for elevation      Hilda Blades, PT, DPT, LAT, ATC 04/01/22  2:51 PM Phone: [phone] Fax: [phone]

## 2022-04-01 NOTE — Patient Instructions (Signed)
Access Code: [email or web address] URL: [email or web address] Date: 04/01/2022 Prepared by: Hilda Blades  Exercises - Supine Shoulder Flexion Extension AAROM with Dowel  - 2 x daily - 2 sets - 10 reps - Supine Shoulder External Rotation with Dowel  - 2 x daily - 10 reps - 5 seconds hold - Sidelying Shoulder External Rotation  - 2 x daily - 2 sets - 10 reps - Sidelying Shoulder Abduction Palm Forward  - 2 x daily - 2 sets - 10 reps - Seated Shoulder Flexion Towel Slide at Table Top  - 2 x daily - 10 reps - 10 seconds hold - Standing Shoulder External Rotation Stretch in Doorway  - 2 x daily - 10 reps - 10 seconds hold - Standing shoulder flexion wall slides  - 2 x daily - 10 reps

## 2022-04-05 ENCOUNTER — Ambulatory Visit: Payer: Medicaid Other | Admitting: Physical Therapy

## 2022-04-06 ENCOUNTER — Telehealth: Payer: Self-pay | Admitting: Physical Therapy

## 2022-04-06 NOTE — Therapy (Signed)
OUTPATIENT PHYSICAL THERAPY TREATMENT NOTE   Patient Name: Theresa Banks MRN: [id number] DOB:10-13-1980, 42 y.o., female Today's Date: 04/07/2022  PCP: None listed REFERRING PROVIDER: Cammy Copa, MD   END OF SESSION:   PT End of Session - 04/07/22 1412     Visit Number 4    Number of Visits 17    Date for PT Re-Evaluation 05/16/22    Authorization Type [employer]    Authorization Time Period 03/23/22 - 06/20/22    Authorization - Visit Number 3    Authorization - Number of Visits 11    PT Start Time 1401    PT Stop Time 1443    PT Time Calculation (min) 42 min    Activity Tolerance Patient tolerated treatment well    Behavior During Therapy [facility] for tasks assessed/performed               Past Medical History:  Diagnosis Date   Anemia    Headache    Past Surgical History:  Procedure Laterality Date   BICEPT TENODESIS Right 02/03/2022   Procedure: BICEPS TENODESIS;  Surgeon: Cammy Copa, MD;  Location: [facility] OR;  Service: Orthopedics;  Laterality: Right;   HAND DEBRIDEMENT Right    SHOULDER ARTHROSCOPY WITH ROTATOR CUFF REPAIR AND SUBACROMIAL DECOMPRESSION Right 02/03/2022   Procedure: RIGHT SHOULDER ARTHROSCOPY, DEBRIDEMENT, MINI OPEN ROTATOR CUFF TEAR REPAIR;  Surgeon: Cammy Copa, MD;  Location: [facility] OR;  Service: Orthopedics;  Laterality: Right;   Patient Active Problem List   Diagnosis Date Noted   Traumatic complete tear of right rotator cuff 02/06/2022   Subluxation of tendon of long head of biceps 02/06/2022   Labral tear of shoulder, right, sequela 11/16/2021   Adhesive capsulitis of right shoulder 11/16/2021   Shoulder dislocation, right, initial encounter 09/08/2021   Indication for care in labor and delivery, antepartum 05/17/2016   Encounter for supervision of other normal pregnancy, third trimester 05/17/2016    REFERRING DIAG: S/P right rotator cuff repair  THERAPY DIAG:  Chronic right shoulder pain  Muscle  weakness (generalized)  Rationale for Evaluation and Treatment Rehabilitation  PERTINENT HISTORY: See PMH above   PRECAUTIONS: Shoulder    SUBJECTIVE:                                                                                                                                                                                     SUBJECTIVE STATEMENT:  Patient states the shoulder burns at times, where she feels like she needs to stretch it.  PAIN:  Are you having pain? No:  NPRS scale: 0/10 Pain location: Right shoulder Pain description: Burning, stiff Aggravating factors: Moving  right shoulder, overhead reach Relieving factors: Rest   OBJECTIVE: (objective measures completed at initial evaluation unless otherwise dated) PATIENT SURVEYS:  Quick Dash 34.1% disability  POSTURE: Rounded shoulder posture   UPPER EXTREMITY ROM:    Active ROM Right eval Left eval Rt 04/01/22 Rt 04/07/22  Shoulder flexion 90 AROM 110 PROM 165 AROM 130 PROM 140 PROM  Shoulder extension        Shoulder abduction 45 AROM 70 PROM      Shoulder adduction        Shoulder internal rotation        Shoulder external rotation 40 PROM      Elbow flexion        Elbow extension        Wrist flexion        Wrist extension        Wrist ulnar deviation        Wrist radial deviation        Wrist pronation        Wrist supination        (Blank rows = not tested)   Patient demonstrates significant shrug with active shoulder elevation   UPPER EXTREMITY MMT:   MMT Right eval Left eval  Shoulder flexion      Shoulder extension      Shoulder abduction      Shoulder adduction      Shoulder internal rotation      Shoulder external rotation      Middle trapezius      Lower trapezius      Elbow flexion      Elbow extension      Wrist flexion      Wrist extension      Wrist ulnar deviation      Wrist radial deviation      Wrist pronation      Wrist supination      Grip strength (lbs)       (Blank rows = not tested)   JOINT MOBILITY TESTING:  Not assessed   PALPATION:  Non tender to palpation               TODAY'S TREATMENT:       [facility] Adult PT Treatment:                                                DATE: 04/07/22 Therapeutic Exercise: UBE L1 x 4 min (fwd/bwd) while taking subjective Seated overhead pulley x 4 min Supine dowel shoulder flexion 2 x 10 Supine dowel serratus punch 2 x 10 Sidelying shoulder abduction 2 x 10 Sidelying shoulder ER 2 x 15 Prone row 2 x 15 Incline 45 deg shoulder flexion 2 x 10 Wall slide x 10 - partial range to avoid shrug Manual Therapy: Right GHJ mobs at various ranges directed at inferior and posterior directions Right shoulder PROM all directions   [facility] Adult PT Treatment:                                                DATE: 04/01/22 Therapeutic Exercise: Supine dowel press x 10 Supine dowel shoulder flexion 2 x 10 Sidelying shoulder abduction 2 x 10 Sidelying shoulder ER 2 x  10 Incline 45 deg shoulder flexion 2 x 10 Wall slide x 10 - partial range to avoid shrug Manual Therapy: Right GHJ mobs at various ranges directed at inferior and posterior directions Right shoulder PROM all directions  [facility] Adult PT Treatment:                                                DATE: 03/29/22 Therapeutic Exercise: Supine dowel press x 15 Supine dowel shoulder flexion x 15 Supine dowel shoulder ER x 15 Wall slide x 15 , flexion and scaption  Pendulum forward and back  Doorway shoulder ER stretch x 5 UE ranger for flexion and adduction, slight abduction standing Seated AROM ER with elbow propped Deltoid isometrics x 10  Table stretch for ER 10 x 10 sec  Manual Therapy: PROM all planes  Inf glides G-H joint seated Rt UE propped at 65 deg  Modalities Cold pack 6 min R UE    PATIENT EDUCATION: Education details: HEP Person educated: Patient Education method: Explanation, Demonstration, Tactile cues, Verbal cues Education  comprehension: verbalized understanding, returned demonstration, verbal cues required, tactile cues required, and needs further education   HOME EXERCISE PROGRAM: Access Code: [email or web address]      ASSESSMENT: CLINICAL IMPRESSION: Patient tolerated therapy well with no adverse effects. Therapy continues to focus on progressing her shoulder mobility and active motion. She does demonstrate improved shoulder passive motion this visit. She is progressing with gravity reduced shoulder active motion but does exhibit shrug with more upright position and with a reduced range of motion. No changes were made to her HEP this visit. Patient would benefit from continued skilled PT to progress her mobility and active motion to maximize her functional ability.    OBJECTIVE IMPAIRMENTS: decreased activity tolerance, decreased ROM, decreased strength, postural dysfunction, and pain.    ACTIVITY LIMITATIONS: carrying, lifting, bathing, dressing, reach over head, and hygiene/grooming   PARTICIPATION LIMITATIONS: meal prep, cleaning, driving, shopping, and occupation   PERSONAL FACTORS: Fitness, Past/current experiences, and Time since onset of injury/illness/exacerbation are also affecting patient's functional outcome.      GOALS: Goals reviewed with patient? Yes   SHORT TERM GOALS: Target date: 04/18/2022   Patient will be I with initial HEP in order to progress with therapy. Baseline: HEP provided at eval Goal status: INITIAL   2.  Patient will demonstrate right shoulder PROM grossly WFL in order to indicate improved right shoulder mobility Baseline: patient demonstrates limitations with right shoulder PROM in all planes (see above) Goal status: INITIAL   3.  Patient will demonstrate right shoulder AROM elevation to 90 deg without shrug compensation to improve shoulder mechanics with raising arm and reduce shoulder pain with bathing and dressing  Baseline: patient demonstrates significant shrug with active  shoulder elevation Goal status: INITIAL   LONG TERM GOALS: Target date: 05/16/2022   Patient will be I with final HEP to maintain progress from PT. Baseline: HEP provided at eval Goal status: INITIAL   2.  Patient will report QuickDASH </= 10% disability in order to indicate improved functional ability using the right arm Baseline: 34.1% disability Goal status: INITIAL   3.  Patient will demonstrate right shoulder AROM grossly WFL in order to normalize overhead reach and allow for ability to perform light household tasks without limitation  Baseline: patient demonstrates limitations in right shoulder AROM in all  planes (see above) Goal status: INITIAL   4.  Patient will demonstrate right rotator cuff strength >/= 4/5 MMT in order to improve lifting, carrying, and performing heavier household tasks without pain or limitation Baseline: not assessed at eval Goal status: INITIAL     PLAN: PT FREQUENCY: 1-2x/week   PT DURATION: 8 weeks   PLANNED INTERVENTIONS: Therapeutic exercises, Therapeutic activity, Neuromuscular re-education, Balance training, Gait training, Patient/Family education, Self Care, Joint mobilization, Joint manipulation, Aquatic Therapy, Dry Needling, Spinal manipulation, Spinal mobilization, Cryotherapy, Moist heat, Taping, Manual therapy, and Re-evaluation   PLAN FOR NEXT SESSION: Review HEP and progress PRN, manual/PROM for right shoulder motion, AAROM for all planes of shoulder motion, progress AROM supine/inclined/upright for elevation      Rosana Hoes, PT, DPT, LAT, ATC 04/07/22  2:52 PM Phone: [phone] Fax: [phone]

## 2022-04-06 NOTE — Telephone Encounter (Signed)
Contacted patient due to missed PT appointment on 04/05/2022. Patient stated she showed up an hour late yesterday and had the appointment time confused. She confirmed her next scheduled appointment and was reminded of attendance policy.  Hilda Blades, PT, DPT, LAT, ATC 04/06/22  1:12 PM Phone: [phone] Fax: [phone]

## 2022-04-07 ENCOUNTER — Ambulatory Visit: Payer: Medicaid Other | Admitting: Physical Therapy

## 2022-04-07 ENCOUNTER — Encounter: Payer: Self-pay | Admitting: Physical Therapy

## 2022-04-07 ENCOUNTER — Other Ambulatory Visit: Payer: Self-pay

## 2022-04-07 DIAGNOSIS — M6281 Muscle weakness (generalized): Secondary | ICD-10-CM

## 2022-04-07 DIAGNOSIS — M25619 Stiffness of unspecified shoulder, not elsewhere classified: Secondary | ICD-10-CM | POA: Diagnosis not present

## 2022-04-07 DIAGNOSIS — M25511 Pain in right shoulder: Secondary | ICD-10-CM | POA: Diagnosis not present

## 2022-04-07 DIAGNOSIS — G8929 Other chronic pain: Secondary | ICD-10-CM | POA: Diagnosis not present

## 2022-04-07 DIAGNOSIS — S43014A Anterior dislocation of right humerus, initial encounter: Secondary | ICD-10-CM | POA: Diagnosis not present

## 2022-04-07 DIAGNOSIS — Z9889 Other specified postprocedural states: Secondary | ICD-10-CM | POA: Diagnosis not present

## 2022-04-14 NOTE — Therapy (Signed)
OUTPATIENT PHYSICAL THERAPY TREATMENT NOTE   Patient Name: Theresa Banks MRN: [id number] DOB:July 13, 1980, 42 y.o., female Today's Date: 04/15/2022  PCP: None listed REFERRING PROVIDER: Cammy Copa, MD   END OF SESSION:   PT End of Session - 04/15/22 0813     Visit Number 5    Number of Visits 17    Date for PT Re-Evaluation 05/16/22    Authorization Type [employer]    Authorization Time Period 03/23/22 - 06/20/22    Authorization - Visit Number 4    Authorization - Number of Visits 11    PT Start Time 0807    PT Stop Time 0845    PT Time Calculation (min) 38 min    Activity Tolerance Patient tolerated treatment well    Behavior During Therapy [facility] for tasks assessed/performed                Past Medical History:  Diagnosis Date   Anemia    Headache    Past Surgical History:  Procedure Laterality Date   BICEPT TENODESIS Right 02/03/2022   Procedure: BICEPS TENODESIS;  Surgeon: Cammy Copa, MD;  Location: [facility] OR;  Service: Orthopedics;  Laterality: Right;   HAND DEBRIDEMENT Right    SHOULDER ARTHROSCOPY WITH ROTATOR CUFF REPAIR AND SUBACROMIAL DECOMPRESSION Right 02/03/2022   Procedure: RIGHT SHOULDER ARTHROSCOPY, DEBRIDEMENT, MINI OPEN ROTATOR CUFF TEAR REPAIR;  Surgeon: Cammy Copa, MD;  Location: [facility] OR;  Service: Orthopedics;  Laterality: Right;   Patient Active Problem List   Diagnosis Date Noted   Traumatic complete tear of right rotator cuff 02/06/2022   Subluxation of tendon of long head of biceps 02/06/2022   Labral tear of shoulder, right, sequela 11/16/2021   Adhesive capsulitis of right shoulder 11/16/2021   Shoulder dislocation, right, initial encounter 09/08/2021   Indication for care in labor and delivery, antepartum 05/17/2016   Encounter for supervision of other normal pregnancy, third trimester 05/17/2016    REFERRING DIAG: S/P right rotator cuff repair  THERAPY DIAG:  Chronic right shoulder  pain  Muscle weakness (generalized)  Rationale for Evaluation and Treatment Rehabilitation  PERTINENT HISTORY: DOS 02/03/2022, see above  PRECAUTIONS: Shoulder    SUBJECTIVE:                                                                                                                                                                                     SUBJECTIVE STATEMENT:  Patient reports her shoulder is doing ok, it does get stiff at times like when she is asleep.  PAIN:  Are you having pain? No:  NPRS scale: 0/10 Pain location: Right shoulder Pain description: Burning, stiff  Aggravating factors: Moving right shoulder, overhead reach Relieving factors: Rest   OBJECTIVE: (objective measures completed at initial evaluation unless otherwise dated) PATIENT SURVEYS:  Quick Dash 34.1% disability  POSTURE: Rounded shoulder posture   UPPER EXTREMITY ROM:    Active ROM Right eval Left eval Rt 04/01/22 Rt 04/07/22 Rt 04/15/22  Shoulder flexion 90 AROM 110 PROM 165 AROM 130 PROM 140 PROM 140 PROM 105 AROM  Shoulder extension         Shoulder abduction 45 AROM 70 PROM       Shoulder adduction         Shoulder internal rotation       L1  Shoulder external rotation 40 PROM       Elbow flexion         Elbow extension         Wrist flexion         Wrist extension         Wrist ulnar deviation         Wrist radial deviation         Wrist pronation         Wrist supination         (Blank rows = not tested)   Patient demonstrates significant shrug with active shoulder elevation   UPPER EXTREMITY MMT:   MMT Right eval Left eval  Shoulder flexion      Shoulder extension      Shoulder abduction      Shoulder adduction      Shoulder internal rotation      Shoulder external rotation      Middle trapezius      Lower trapezius      Elbow flexion      Elbow extension      Wrist flexion      Wrist extension      Wrist ulnar deviation      Wrist radial deviation       Wrist pronation      Wrist supination      Grip strength (lbs)      (Blank rows = not tested)   JOINT MOBILITY TESTING:  Not assessed   PALPATION:  Non tender to palpation               TODAY'S TREATMENT:       [facility] Adult PT Treatment:                                                DATE: 04/15/22 Therapeutic Exercise: UBE L1 x 4 min (fwd/bwd) while taking subjective Supine dowel shoulder flexion with 2# 2 x 10 Supine dowel serratus punch with 2# 2 x 10 Sidelying shoulder abduction with 1# 2 x 10 Sidelying shoulder ER with 1# 2 x 15 Prone row with 2# 2 x 10 Prone extension with 1# 2 x 10 Seated overhead pulley x 2 min Incline 50 deg shoulder flexion 2 x 10 Wall physioball roll-up 2 x 10 - partial range to avoid shrug Standing shoulder extension with dowel x 10 Standing IR reach behind back stretch with towel x 5   [facility] Adult PT Treatment:  DATE: 04/07/22 Therapeutic Exercise: UBE L1 x 4 min (fwd/bwd) while taking subjective Seated overhead pulley x 4 min Supine dowel shoulder flexion 2 x 10 Supine dowel serratus punch 2 x 10 Sidelying shoulder abduction 2 x 10 Sidelying shoulder ER 2 x 15 Prone row 2 x 15 Incline 45 deg shoulder flexion 2 x 10 Wall slide x 10 - partial range to avoid shrug Manual Therapy: Right GHJ mobs at various ranges directed at inferior and posterior directions Right shoulder PROM all directions  [facility] Adult PT Treatment:                                                DATE: 04/01/22 Therapeutic Exercise: Supine dowel press x 10 Supine dowel shoulder flexion 2 x 10 Sidelying shoulder abduction 2 x 10 Sidelying shoulder ER 2 x 10 Incline 45 deg shoulder flexion 2 x 10 Wall slide x 10 - partial range to avoid shrug Manual Therapy: Right GHJ mobs at various ranges directed at inferior and posterior directions Right shoulder PROM all directions   PATIENT EDUCATION: Education details: HEP Person  educated: Patient Education method: Explanation, Demonstration, Tactile cues, Verbal cues Education comprehension: verbalized understanding, returned demonstration, verbal cues required, tactile cues required, and needs further education   HOME EXERCISE PROGRAM: Access Code: [email or web address]      ASSESSMENT: CLINICAL IMPRESSION: Patient tolerated therapy well with no adverse effects. Therapy focused primarily on progressing her shoulder motion with good tolerance. She does demonstrate improved AROM this visit and is progressing well through her exercises. Initiated reach behind back stretching with good tolerance. No changes were made to her HEP this visit. Patient would benefit from continued skilled PT to progress her mobility and active motion to maximize her functional ability.    OBJECTIVE IMPAIRMENTS: decreased activity tolerance, decreased ROM, decreased strength, postural dysfunction, and pain.    ACTIVITY LIMITATIONS: carrying, lifting, bathing, dressing, reach over head, and hygiene/grooming   PARTICIPATION LIMITATIONS: meal prep, cleaning, driving, shopping, and occupation   PERSONAL FACTORS: Fitness, Past/current experiences, and Time since onset of injury/illness/exacerbation are also affecting patient's functional outcome.      GOALS: Goals reviewed with patient? Yes   SHORT TERM GOALS: Target date: 04/18/2022   Patient will be I with initial HEP in order to progress with therapy. Baseline: HEP provided at eval Goal status: INITIAL   2.  Patient will demonstrate right shoulder PROM grossly WFL in order to indicate improved right shoulder mobility Baseline: patient demonstrates limitations with right shoulder PROM in all planes (see above) Goal status: INITIAL   3.  Patient will demonstrate right shoulder AROM elevation to 90 deg without shrug compensation to improve shoulder mechanics with raising arm and reduce shoulder pain with bathing and dressing  Baseline: patient  demonstrates significant shrug with active shoulder elevation Goal status: INITIAL   LONG TERM GOALS: Target date: 05/16/2022   Patient will be I with final HEP to maintain progress from PT. Baseline: HEP provided at eval Goal status: INITIAL   2.  Patient will report QuickDASH </= 10% disability in order to indicate improved functional ability using the right arm Baseline: 34.1% disability Goal status: INITIAL   3.  Patient will demonstrate right shoulder AROM grossly WFL in order to normalize overhead reach and allow for ability to perform light household tasks without limitation  Baseline: patient demonstrates limitations  in right shoulder AROM in all planes (see above) Goal status: INITIAL   4.  Patient will demonstrate right rotator cuff strength >/= 4/5 MMT in order to improve lifting, carrying, and performing heavier household tasks without pain or limitation Baseline: not assessed at eval Goal status: INITIAL     PLAN: PT FREQUENCY: 1-2x/week   PT DURATION: 8 weeks   PLANNED INTERVENTIONS: Therapeutic exercises, Therapeutic activity, Neuromuscular re-education, Balance training, Gait training, Patient/Family education, Self Care, Joint mobilization, Joint manipulation, Aquatic Therapy, Dry Needling, Spinal manipulation, Spinal mobilization, Cryotherapy, Moist heat, Taping, Manual therapy, and Re-evaluation   PLAN FOR NEXT SESSION: Assess STGs, review HEP and progress PRN, manual/PROM for right shoulder motion, AAROM for all planes of shoulder motion, progress AROM supine/inclined/upright for elevation      Rosana Hoes, PT, DPT, LAT, ATC 04/15/22  8:55 AM Phone: [phone] Fax: [phone]

## 2022-04-15 ENCOUNTER — Other Ambulatory Visit: Payer: Self-pay

## 2022-04-15 ENCOUNTER — Ambulatory Visit: Payer: Medicaid Other | Attending: Orthopedic Surgery | Admitting: Physical Therapy

## 2022-04-15 ENCOUNTER — Encounter: Payer: Self-pay | Admitting: Physical Therapy

## 2022-04-15 DIAGNOSIS — M6281 Muscle weakness (generalized): Secondary | ICD-10-CM | POA: Diagnosis not present

## 2022-04-15 DIAGNOSIS — M25511 Pain in right shoulder: Secondary | ICD-10-CM | POA: Insufficient documentation

## 2022-04-15 DIAGNOSIS — G8929 Other chronic pain: Secondary | ICD-10-CM | POA: Diagnosis not present

## 2022-04-18 ENCOUNTER — Ambulatory Visit: Payer: Medicaid Other

## 2022-04-18 DIAGNOSIS — M6281 Muscle weakness (generalized): Secondary | ICD-10-CM

## 2022-04-18 DIAGNOSIS — G8929 Other chronic pain: Secondary | ICD-10-CM | POA: Diagnosis not present

## 2022-04-18 DIAGNOSIS — M25511 Pain in right shoulder: Secondary | ICD-10-CM | POA: Diagnosis not present

## 2022-04-18 NOTE — Therapy (Signed)
OUTPATIENT PHYSICAL THERAPY TREATMENT NOTE   Patient Name: Theresa Banks MRN: [id number] DOB:[birth year], 42 y.o., female Today's Date: 04/18/2022  PCP: None listed REFERRING PROVIDER: Cammy Copa, MD   END OF SESSION:   PT End of Session - 04/18/22 1414     Visit Number 6    Number of Visits 17    Date for PT Re-Evaluation 05/16/22    Authorization Type [employer]    Authorization Time Period 03/23/22 - 06/20/22    Authorization - Visit Number 5    Authorization - Number of Visits 11    PT Start Time 1414    PT Stop Time 1457    PT Time Calculation (min) 43 min    Activity Tolerance Patient tolerated treatment well    Behavior During Therapy [facility] for tasks assessed/performed                 Past Medical History:  Diagnosis Date   Anemia    Headache    Past Surgical History:  Procedure Laterality Date   BICEPT TENODESIS Right 02/03/2022   Procedure: BICEPS TENODESIS;  Surgeon: Cammy Copa, MD;  Location: [facility] OR;  Service: Orthopedics;  Laterality: Right;   HAND DEBRIDEMENT Right    SHOULDER ARTHROSCOPY WITH ROTATOR CUFF REPAIR AND SUBACROMIAL DECOMPRESSION Right 02/03/2022   Procedure: RIGHT SHOULDER ARTHROSCOPY, DEBRIDEMENT, MINI OPEN ROTATOR CUFF TEAR REPAIR;  Surgeon: Cammy Copa, MD;  Location: [facility] OR;  Service: Orthopedics;  Laterality: Right;   Patient Active Problem List   Diagnosis Date Noted   Traumatic complete tear of right rotator cuff 02/06/2022   Subluxation of tendon of long head of biceps 02/06/2022   Labral tear of shoulder, right, sequela 11/16/2021   Adhesive capsulitis of right shoulder 11/16/2021   Shoulder dislocation, right, initial encounter 09/08/2021   Indication for care in labor and delivery, antepartum 05/17/2016   Encounter for supervision of other normal pregnancy, third trimester 05/17/2016    REFERRING DIAG: S/P right rotator cuff repair  THERAPY DIAG:  Chronic right shoulder  pain  Muscle weakness (generalized)  Rationale for Evaluation and Treatment Rehabilitation  PERTINENT HISTORY: DOS 02/03/2022, see above  PRECAUTIONS: Shoulder    SUBJECTIVE:                                                                                                                                                                                     SUBJECTIVE STATEMENT:  "It's been doing ok. I got some anxiety from the weights last time."   PAIN:  Are you having pain? No   OBJECTIVE: (objective measures completed at initial evaluation unless otherwise dated) PATIENT SURVEYS:  Quick Dash 34.1% disability  POSTURE: Rounded shoulder posture   UPPER EXTREMITY ROM:    Active ROM Right eval Left eval Rt 04/01/22 Rt 04/07/22 Rt 04/15/22 04/18/22  Shoulder flexion 90 AROM 110 PROM 165 AROM 130 PROM 140 PROM 140 PROM 105 AROM 140 PROM 78 AROM without shrug  Shoulder extension          Shoulder abduction 45 AROM 70 PROM      96 PROM  Shoulder adduction          Shoulder internal rotation       L1   Shoulder external rotation 40 PROM      55 PROM   Elbow flexion          Elbow extension          Wrist flexion          Wrist extension          Wrist ulnar deviation          Wrist radial deviation          Wrist pronation          Wrist supination          (Blank rows = not tested)   Patient demonstrates significant shrug with active shoulder elevation   UPPER EXTREMITY MMT:   MMT Right eval Left eval  Shoulder flexion      Shoulder extension      Shoulder abduction      Shoulder adduction      Shoulder internal rotation      Shoulder external rotation      Middle trapezius      Lower trapezius      Elbow flexion      Elbow extension      Wrist flexion      Wrist extension      Wrist ulnar deviation      Wrist radial deviation      Wrist pronation      Wrist supination      Grip strength (lbs)      (Blank rows = not tested)   JOINT MOBILITY TESTING:   Not assessed   PALPATION:  Non tender to palpation               TODAY'S TREATMENT:       [facility] Adult PT Treatment:                                                DATE: 04/18/22 Therapeutic Exercise: UBE level 1 x 2 min each fwd/bwd  Supine shoulder flexion AROM 2 x 10  Sidelying shoulder abduction AROM 2 x 10; 2nd round with 1 lb Sidelying ER 2 x 10; 1#  Seated shoulder abduction AROM with elbow bent 2 x 10; partial range  Manual Therapy: Rt shoulder PROM to tolerance in all planes  Rt GHJ gentle distraction     [facility] Adult PT Treatment:                                                DATE: 04/15/22 Therapeutic Exercise: UBE L1 x 4 min (fwd/bwd) while taking subjective Supine dowel shoulder flexion with 2# 2 x 10 Supine dowel  serratus punch with 2# 2 x 10 Sidelying shoulder abduction with 1# 2 x 10 Sidelying shoulder ER with 1# 2 x 15 Prone row with 2# 2 x 10 Prone extension with 1# 2 x 10 Seated overhead pulley x 2 min Incline 50 deg shoulder flexion 2 x 10 Wall physioball roll-up 2 x 10 - partial range to avoid shrug Standing shoulder extension with dowel x 10 Standing IR reach behind back stretch with towel x 5   [facility] Adult PT Treatment:                                                DATE: 04/07/22 Therapeutic Exercise: UBE L1 x 4 min (fwd/bwd) while taking subjective Seated overhead pulley x 4 min Supine dowel shoulder flexion 2 x 10 Supine dowel serratus punch 2 x 10 Sidelying shoulder abduction 2 x 10 Sidelying shoulder ER 2 x 15 Prone row 2 x 15 Incline 45 deg shoulder flexion 2 x 10 Wall slide x 10 - partial range to avoid shrug Manual Therapy: Right GHJ mobs at various ranges directed at inferior and posterior directions Right shoulder PROM all directions   PATIENT EDUCATION: Education details: HEP Person educated: Patient Education method: Explanation, Demonstration, Tactile cues, Verbal cues Education comprehension: verbalized understanding, returned  demonstration, verbal cues required, tactile cues required, and needs further education   HOME EXERCISE PROGRAM: Access Code: [email or web address]      ASSESSMENT: CLINICAL IMPRESSION: Patient tolerated therapy well with no adverse effects. Her Rt shoulder PROM is gradually improving, but remains limited in all planes secondary to pain. Continued with progression of Rt shoulder ROM and strengthening with good tolerance. She reported pulling sensation with flexion AROM, but not pain. With sidelying shoulder abduction she has tendency to compensate with shoulder flexion requiring cues to maintain movement in the frontal plane. Able to initiate abduction AROM in sitting with patient able to complete through partial range with elbow flexed without shoulder shrug present.     OBJECTIVE IMPAIRMENTS: decreased activity tolerance, decreased ROM, decreased strength, postural dysfunction, and pain.    ACTIVITY LIMITATIONS: carrying, lifting, bathing, dressing, reach over head, and hygiene/grooming   PARTICIPATION LIMITATIONS: meal prep, cleaning, driving, shopping, and occupation   PERSONAL FACTORS: Fitness, Past/current experiences, and Time since onset of injury/illness/exacerbation are also affecting patient's functional outcome.      GOALS: Goals reviewed with patient? Yes   SHORT TERM GOALS: Target date: 04/18/2022   Patient will be I with initial HEP in order to progress with therapy. Baseline: HEP provided at eval Goal status: met    2.  Patient will demonstrate right shoulder PROM grossly WFL in order to indicate improved right shoulder mobility Baseline: patient demonstrates limitations with right shoulder PROM in all planes (see above) Goal status: progressing    3.  Patient will demonstrate right shoulder AROM elevation to 90 deg without shrug compensation to improve shoulder mechanics with raising arm and reduce shoulder pain with bathing and dressing  Baseline: patient demonstrates significant  shrug with active shoulder elevation Goal status: progressing    LONG TERM GOALS: Target date: 05/16/2022   Patient will be I with final HEP to maintain progress from PT. Baseline: HEP provided at eval Goal status: INITIAL   2.  Patient will report QuickDASH </= 10% disability in order to indicate improved functional ability using the right arm  Baseline: 34.1% disability Goal status: INITIAL   3.  Patient will demonstrate right shoulder AROM grossly WFL in order to normalize overhead reach and allow for ability to perform light household tasks without limitation  Baseline: patient demonstrates limitations in right shoulder AROM in all planes (see above) Goal status: INITIAL   4.  Patient will demonstrate right rotator cuff strength >/= 4/5 MMT in order to improve lifting, carrying, and performing heavier household tasks without pain or limitation Baseline: not assessed at eval Goal status: INITIAL     PLAN: PT FREQUENCY: 1-2x/week   PT DURATION: 8 weeks   PLANNED INTERVENTIONS: Therapeutic exercises, Therapeutic activity, Neuromuscular re-education, Balance training, Gait training, Patient/Family education, Self Care, Joint mobilization, Joint manipulation, Aquatic Therapy, Dry Needling, Spinal manipulation, Spinal mobilization, Cryotherapy, Moist heat, Taping, Manual therapy, and Re-evaluation   PLAN FOR NEXT SESSION:  review HEP and progress PRN, manual/PROM for right shoulder motion, AAROM for all planes of shoulder motion, progress AROM supine/inclined/upright for elevation     Letitia Libra, PT, DPT, ATC 04/18/22 2:58 PM

## 2022-04-22 ENCOUNTER — Encounter: Payer: Self-pay | Admitting: Physical Therapy

## 2022-04-22 ENCOUNTER — Ambulatory Visit: Payer: Medicaid Other | Admitting: Physical Therapy

## 2022-04-22 DIAGNOSIS — M6281 Muscle weakness (generalized): Secondary | ICD-10-CM

## 2022-04-22 DIAGNOSIS — G8929 Other chronic pain: Secondary | ICD-10-CM

## 2022-04-22 DIAGNOSIS — M25511 Pain in right shoulder: Secondary | ICD-10-CM | POA: Diagnosis not present

## 2022-04-22 NOTE — Therapy (Signed)
OUTPATIENT PHYSICAL THERAPY TREATMENT NOTE   Patient Name: Theresa Banks MRN: [id number] DOB:03-20-1980, 42 y.o., female Today's Date: 04/22/2022  PCP: None listed REFERRING PROVIDER: Cammy Copa, MD   END OF SESSION:   PT End of Session - 04/22/22 0849     Visit Number 7    Number of Visits 17    Date for PT Re-Evaluation 05/16/22    Authorization Type [employer]    Authorization Time Period 03/23/22 - 06/20/22    Authorization - Visit Number 6    Authorization - Number of Visits 11    PT Start Time 0847    PT Stop Time 0928    PT Time Calculation (min) 41 min                 Past Medical History:  Diagnosis Date   Anemia    Headache    Past Surgical History:  Procedure Laterality Date   BICEPT TENODESIS Right 02/03/2022   Procedure: BICEPS TENODESIS;  Surgeon: Cammy Copa, MD;  Location: [facility] OR;  Service: Orthopedics;  Laterality: Right;   HAND DEBRIDEMENT Right    SHOULDER ARTHROSCOPY WITH ROTATOR CUFF REPAIR AND SUBACROMIAL DECOMPRESSION Right 02/03/2022   Procedure: RIGHT SHOULDER ARTHROSCOPY, DEBRIDEMENT, MINI OPEN ROTATOR CUFF TEAR REPAIR;  Surgeon: Cammy Copa, MD;  Location: [facility] OR;  Service: Orthopedics;  Laterality: Right;   Patient Active Problem List   Diagnosis Date Noted   Traumatic complete tear of right rotator cuff 02/06/2022   Subluxation of tendon of long head of biceps 02/06/2022   Labral tear of shoulder, right, sequela 11/16/2021   Adhesive capsulitis of right shoulder 11/16/2021   Shoulder dislocation, right, initial encounter 09/08/2021   Indication for care in labor and delivery, antepartum 05/17/2016   Encounter for supervision of other normal pregnancy, third trimester 05/17/2016    REFERRING DIAG: S/P right rotator cuff repair  THERAPY DIAG:  Chronic right shoulder pain  Muscle weakness (generalized)  Rationale for Evaluation and Treatment Rehabilitation  PERTINENT HISTORY: DOS  02/03/2022, see above  PRECAUTIONS: Shoulder    SUBJECTIVE:                                                                                                                                                                                     SUBJECTIVE STATEMENT:  "It's been doing ok. I got some anxiety from the weights last time."   PAIN:  Are you having pain? No   OBJECTIVE: (objective measures completed at initial evaluation unless otherwise dated) PATIENT SURVEYS:  Quick Dash 34.1% disability  POSTURE: Rounded shoulder posture   UPPER EXTREMITY ROM:    Active ROM  Right eval Left eval Rt 04/01/22 Rt 04/07/22 Rt 04/15/22 04/18/22  Shoulder flexion 90 AROM 110 PROM 165 AROM 130 PROM 140 PROM 140 PROM 105 AROM 140 PROM 78 AROM without shrug  Shoulder extension          Shoulder abduction 45 AROM 70 PROM      96 PROM  Shoulder adduction          Shoulder internal rotation       L1   Shoulder external rotation 40 PROM      55 PROM   Elbow flexion          Elbow extension          Wrist flexion          Wrist extension          Wrist ulnar deviation          Wrist radial deviation          Wrist pronation          Wrist supination          (Blank rows = not tested)   Patient demonstrates significant shrug with active shoulder elevation   UPPER EXTREMITY MMT:   MMT Right eval Left eval  Shoulder flexion      Shoulder extension      Shoulder abduction      Shoulder adduction      Shoulder internal rotation      Shoulder external rotation      Middle trapezius      Lower trapezius      Elbow flexion      Elbow extension      Wrist flexion      Wrist extension      Wrist ulnar deviation      Wrist radial deviation      Wrist pronation      Wrist supination      Grip strength (lbs)      (Blank rows = not tested)   JOINT MOBILITY TESTING:  Not assessed   PALPATION:  Non tender to palpation               TODAY'S TREATMENT:       [facility] Adult PT  Treatment:                                                DATE: 04/22/22 Therapeutic Exercise: Supine shoulder flexion AROM 1 x 10 Sidelying shoulder abduction AROM 2 x 10; 2nd round with 1 lb (focusing shoulder position) Incline 45 degrees, dowel pullovers , AAROM ER  Prone row with 2# 2 x 10 Prone shoulder Ext AROM x 10  2# x 10 Standing scaption bilateral -shoulder height with focus on scap depression 5 x 2  Manual Therapy: GHJ mobs , PROM  [facility] Adult PT Treatment:                                                DATE: 04/18/22 Therapeutic Exercise: UBE level 1 x 2 min each fwd/bwd  Supine shoulder flexion AROM 2 x 10  Sidelying shoulder abduction AROM 2 x 10; 2nd round with 1 lb Sidelying ER 2 x 10; 1#  Seated shoulder  abduction AROM with elbow bent 2 x 10; partial range  Manual Therapy: Rt shoulder PROM to tolerance in all planes  Rt GHJ gentle distraction     [facility] Adult PT Treatment:                                                DATE: 04/15/22 Therapeutic Exercise: UBE L1 x 4 min (fwd/bwd) while taking subjective Supine dowel shoulder flexion with 2# 2 x 10 Supine dowel serratus punch with 2# 2 x 10 Sidelying shoulder abduction with 1# 2 x 10 Sidelying shoulder ER with 1# 2 x 15 Prone row with 2# 2 x 10 Prone extension with 1# 2 x 10 Seated overhead pulley x 2 min Incline 50 deg shoulder flexion 2 x 10 Wall physioball roll-up 2 x 10 - partial range to avoid shrug Standing shoulder extension with dowel x 10 Standing IR reach behind back stretch with towel x 5   [facility] Adult PT Treatment:                                                DATE: 04/07/22 Therapeutic Exercise: UBE L1 x 4 min (fwd/bwd) while taking subjective Seated overhead pulley x 4 min Supine dowel shoulder flexion 2 x 10 Supine dowel serratus punch 2 x 10 Sidelying shoulder abduction 2 x 10 Sidelying shoulder ER 2 x 15 Prone row 2 x 15 Incline 45 deg shoulder flexion 2 x 10 Wall slide x 10 - partial range  to avoid shrug Manual Therapy: Right GHJ mobs at various ranges directed at inferior and posterior directions Right shoulder PROM all directions   PATIENT EDUCATION: Education details: HEP Person educated: Patient Education method: Explanation, Demonstration, Tactile cues, Verbal cues Education comprehension: verbalized understanding, returned demonstration, verbal cues required, tactile cues required, and needs further education   HOME EXERCISE PROGRAM: Access Code: [email or web address]      ASSESSMENT: CLINICAL IMPRESSION: Pt reports no pain on arrival and no pain with ADLs. She does avoid lifting with her RUE. Manual performed at start of session followed by AROM and light strengthening.   Patient tolerated therapy well with no adverse effects.     OBJECTIVE IMPAIRMENTS: decreased activity tolerance, decreased ROM, decreased strength, postural dysfunction, and pain.    ACTIVITY LIMITATIONS: carrying, lifting, bathing, dressing, reach over head, and hygiene/grooming   PARTICIPATION LIMITATIONS: meal prep, cleaning, driving, shopping, and occupation   PERSONAL FACTORS: Fitness, Past/current experiences, and Time since onset of injury/illness/exacerbation are also affecting patient's functional outcome.      GOALS: Goals reviewed with patient? Yes   SHORT TERM GOALS: Target date: 04/18/2022   Patient will be I with initial HEP in order to progress with therapy. Baseline: HEP provided at eval Goal status: met    2.  Patient will demonstrate right shoulder PROM grossly WFL in order to indicate improved right shoulder mobility Baseline: patient demonstrates limitations with right shoulder PROM in all planes (see above) Goal status: progressing    3.  Patient will demonstrate right shoulder AROM elevation to 90 deg without shrug compensation to improve shoulder mechanics with raising arm and reduce shoulder pain with bathing and dressing  Baseline: patient demonstrates significant shrug  with active shoulder elevation  Goal status: progressing    LONG TERM GOALS: Target date: 05/16/2022   Patient will be I with final HEP to maintain progress from PT. Baseline: HEP provided at eval Goal status: INITIAL   2.  Patient will report QuickDASH </= 10% disability in order to indicate improved functional ability using the right arm Baseline: 34.1% disability Goal status: INITIAL   3.  Patient will demonstrate right shoulder AROM grossly WFL in order to normalize overhead reach and allow for ability to perform light household tasks without limitation  Baseline: patient demonstrates limitations in right shoulder AROM in all planes (see above) Goal status: INITIAL   4.  Patient will demonstrate right rotator cuff strength >/= 4/5 MMT in order to improve lifting, carrying, and performing heavier household tasks without pain or limitation Baseline: not assessed at eval Goal status: INITIAL     PLAN: PT FREQUENCY: 1-2x/week   PT DURATION: 8 weeks   PLANNED INTERVENTIONS: Therapeutic exercises, Therapeutic activity, Neuromuscular re-education, Balance training, Gait training, Patient/Family education, Self Care, Joint mobilization, Joint manipulation, Aquatic Therapy, Dry Needling, Spinal manipulation, Spinal mobilization, Cryotherapy, Moist heat, Taping, Manual therapy, and Re-evaluation   PLAN FOR NEXT SESSION:  review HEP and progress PRN, manual/PROM for right shoulder motion, AAROM for all planes of shoulder motion, progress AROM supine/inclined/upright for elevation     Jannette Spanner, PTA 04/22/22 1:38 PM Phone: [phone] Fax: [phone]

## 2022-04-25 NOTE — Therapy (Signed)
OUTPATIENT PHYSICAL THERAPY TREATMENT NOTE   Patient Name: Theresa Banks MRN: [id number] DOB:[birth year], 42 y.o., female Today's Date: 04/26/2022  PCP: None listed REFERRING PROVIDER: Cammy Copa, MD   END OF SESSION:   PT End of Session - 04/26/22 1457     Visit Number 8    Number of Visits 17    Date for PT Re-Evaluation 05/16/22    Authorization Type [employer]    Authorization Time Period 03/23/22 - 06/20/22    Authorization - Visit Number 7    Authorization - Number of Visits 11    PT Start Time 1453    PT Stop Time 1531    PT Time Calculation (min) 38 min    Activity Tolerance Patient tolerated treatment well    Behavior During Therapy [facility] for tasks assessed/performed                  Past Medical History:  Diagnosis Date   Anemia    Headache    Past Surgical History:  Procedure Laterality Date   BICEPT TENODESIS Right 02/03/2022   Procedure: BICEPS TENODESIS;  Surgeon: Cammy Copa, MD;  Location: [facility] OR;  Service: Orthopedics;  Laterality: Right;   HAND DEBRIDEMENT Right    SHOULDER ARTHROSCOPY WITH ROTATOR CUFF REPAIR AND SUBACROMIAL DECOMPRESSION Right 02/03/2022   Procedure: RIGHT SHOULDER ARTHROSCOPY, DEBRIDEMENT, MINI OPEN ROTATOR CUFF TEAR REPAIR;  Surgeon: Cammy Copa, MD;  Location: [facility] OR;  Service: Orthopedics;  Laterality: Right;   Patient Active Problem List   Diagnosis Date Noted   Traumatic complete tear of right rotator cuff 02/06/2022   Subluxation of tendon of long head of biceps 02/06/2022   Labral tear of shoulder, right, sequela 11/16/2021   Adhesive capsulitis of right shoulder 11/16/2021   Shoulder dislocation, right, initial encounter 09/08/2021   Indication for care in labor and delivery, antepartum 05/17/2016   Encounter for supervision of other normal pregnancy, third trimester 05/17/2016    REFERRING DIAG: S/P right rotator cuff repair  THERAPY DIAG:  Chronic right shoulder  pain  Muscle weakness (generalized)  Rationale for Evaluation and Treatment Rehabilitation  PERTINENT HISTORY: DOS 02/03/2022, see above  PRECAUTIONS: Shoulder    SUBJECTIVE:                                                                                                                                                                                     SUBJECTIVE STATEMENT:  Patient reports her shoulder is feeling good. She feels like her shoulder is starting to drop some when lifting her arms up.  PAIN:  Are you having pain? No   OBJECTIVE: (objective measures completed  at initial evaluation unless otherwise dated) PATIENT SURVEYS:  Quick Dash 34.1% disability  POSTURE: Rounded shoulder posture   UPPER EXTREMITY ROM:    Active ROM Right eval Left eval Rt 04/01/22 Rt 04/07/22 Rt 04/15/22  04/18/22  Shoulder flexion 90 AROM 110 PROM 165 AROM 130 PROM 140 PROM 140 PROM 105 AROM 140 PROM 78 AROM without shrug  Shoulder extension          Shoulder abduction 45 AROM 70 PROM      96 PROM  Shoulder adduction          Shoulder internal rotation       L1   Shoulder external rotation 40 PROM      55 PROM   Elbow flexion          Elbow extension          Wrist flexion          Wrist extension          Wrist ulnar deviation          Wrist radial deviation          Wrist pronation          Wrist supination          (Blank rows = not tested)   Patient demonstrates significant shrug with active shoulder elevation   UPPER EXTREMITY MMT:   MMT Right eval Left eval  Shoulder flexion      Shoulder extension      Shoulder abduction      Shoulder adduction      Shoulder internal rotation      Shoulder external rotation      Middle trapezius      Lower trapezius      Elbow flexion      Elbow extension      Wrist flexion      Wrist extension      Wrist ulnar deviation      Wrist radial deviation      Wrist pronation      Wrist supination      Grip strength (lbs)       (Blank rows = not tested)   JOINT MOBILITY TESTING:  Not assessed   PALPATION:  Non tender to palpation               TODAY'S TREATMENT:       [facility] Adult PT Treatment:                                                DATE: 04/26/22 Therapeutic Exercise: UBE L1 x 4 min (fwd/bwd) while taking subjective Supine shoulder flexion with 2# 2 x 10 Supine serratus punch with 2# 2 x 10 Sidelying shoulder abduction with 2# 2 x 10 Sidelying shoulder ER with 2# 2 x 15 Wall physioball roll-up x 10 Incline 50 deg shoulder flexion x 10, with 1# x 10 Standing scaption to 90 deg x 10 Manual Therapy: Right GHJ mobs at various ranges directed at inferior and posterior directions Right shoulder PROM all directions   [facility] Adult PT Treatment:  DATE: 04/22/22 Therapeutic Exercise: Supine shoulder flexion AROM 1 x 10 Sidelying shoulder abduction AROM 2 x 10; 2nd round with 1 lb (focusing shoulder position) Incline 45 degrees, dowel pullovers , AAROM ER  Prone row with 2# 2 x 10 Prone shoulder Ext AROM x 10  2# x 10 Standing scaption bilateral -shoulder height with focus on scap depression 5 x 2  Manual Therapy: GHJ mobs , PROM  [facility] Adult PT Treatment:                                                DATE: 04/18/22 Therapeutic Exercise: UBE level 1 x 2 min each fwd/bwd  Supine shoulder flexion AROM 2 x 10  Sidelying shoulder abduction AROM 2 x 10; 2nd round with 1 lb Sidelying ER 2 x 10; 1#  Seated shoulder abduction AROM with elbow bent 2 x 10; partial range  Manual Therapy: Rt shoulder PROM to tolerance in all planes  Rt GHJ gentle distraction    PATIENT EDUCATION: Education details: HEP Person educated: Patient Education method: Programmer, multimedia, Facilities manager, Actor cues, Verbal cues Education comprehension: verbalized understanding, returned demonstration, verbal cues required, tactile cues required, and needs further education   HOME EXERCISE  PROGRAM: Access Code: [email or web address]      ASSESSMENT: CLINICAL IMPRESSION: Patient tolerated therapy well with no adverse effects. Therapy continues to focus on progressing right shoulder mobility and active motion, and incorporating light weight with gravity reduced positions to avoid shrug and ensure proper shoulder mechanics. She was able to demonstrate improved AROM with less shrug this visit but does remain limited overall with her right shoulder motion. No changes to HEP this visit. Patient would benefit from continued skilled PT to progress her mobility and active motion to maximize her functional ability.    OBJECTIVE IMPAIRMENTS: decreased activity tolerance, decreased ROM, decreased strength, postural dysfunction, and pain.    ACTIVITY LIMITATIONS: carrying, lifting, bathing, dressing, reach over head, and hygiene/grooming   PARTICIPATION LIMITATIONS: meal prep, cleaning, driving, shopping, and occupation   PERSONAL FACTORS: Fitness, Past/current experiences, and Time since onset of injury/illness/exacerbation are also affecting patient's functional outcome.      GOALS: Goals reviewed with patient? Yes   SHORT TERM GOALS: Target date: 04/18/2022   Patient will be I with initial HEP in order to progress with therapy. Baseline: HEP provided at eval Goal status: met    2.  Patient will demonstrate right shoulder PROM grossly WFL in order to indicate improved right shoulder mobility Baseline: patient demonstrates limitations with right shoulder PROM in all planes (see above) Goal status: progressing    3.  Patient will demonstrate right shoulder AROM elevation to 90 deg without shrug compensation to improve shoulder mechanics with raising arm and reduce shoulder pain with bathing and dressing  Baseline: patient demonstrates significant shrug with active shoulder elevation Goal status: progressing    LONG TERM GOALS: Target date: 05/16/2022   Patient will be I with final HEP to  maintain progress from PT. Baseline: HEP provided at eval Goal status: INITIAL   2.  Patient will report QuickDASH </= 10% disability in order to indicate improved functional ability using the right arm Baseline: 34.1% disability Goal status: INITIAL   3.  Patient will demonstrate right shoulder AROM grossly WFL in order to normalize overhead reach and allow for ability to perform light household tasks without  limitation  Baseline: patient demonstrates limitations in right shoulder AROM in all planes (see above) Goal status: INITIAL   4.  Patient will demonstrate right rotator cuff strength >/= 4/5 MMT in order to improve lifting, carrying, and performing heavier household tasks without pain or limitation Baseline: not assessed at eval Goal status: INITIAL     PLAN: PT FREQUENCY: 1-2x/week   PT DURATION: 8 weeks   PLANNED INTERVENTIONS: Therapeutic exercises, Therapeutic activity, Neuromuscular re-education, Balance training, Gait training, Patient/Family education, Self Care, Joint mobilization, Joint manipulation, Aquatic Therapy, Dry Needling, Spinal manipulation, Spinal mobilization, Cryotherapy, Moist heat, Taping, Manual therapy, and Re-evaluation   PLAN FOR NEXT SESSION:  review HEP and progress PRN, manual/PROM for right shoulder motion, AAROM for all planes of shoulder motion, progress AROM supine/inclined/upright for elevation     Rosana Hoes, PT, DPT, LAT, ATC 04/26/22  3:32 PM Phone: [phone] Fax: [phone]

## 2022-04-26 ENCOUNTER — Ambulatory Visit: Payer: Medicaid Other | Admitting: Physical Therapy

## 2022-04-26 ENCOUNTER — Encounter: Payer: Self-pay | Admitting: Physical Therapy

## 2022-04-26 ENCOUNTER — Other Ambulatory Visit: Payer: Self-pay

## 2022-04-26 DIAGNOSIS — G8929 Other chronic pain: Secondary | ICD-10-CM

## 2022-04-26 DIAGNOSIS — M6281 Muscle weakness (generalized): Secondary | ICD-10-CM

## 2022-04-26 DIAGNOSIS — M25511 Pain in right shoulder: Secondary | ICD-10-CM | POA: Diagnosis not present

## 2022-04-27 NOTE — Therapy (Signed)
OUTPATIENT PHYSICAL THERAPY TREATMENT NOTE   Patient Name: Theresa Banks MRN: [id number] DOB:1980/12/20, 42 y.o., female Today's Date: 04/28/2022  PCP: None listed REFERRING PROVIDER: Cammy Copa, MD   END OF SESSION:   PT End of Session - 04/28/22 1457     Visit Number 9    Number of Visits 17    Date for PT Re-Evaluation 05/16/22    Authorization Type [employer]    Authorization Time Period 03/23/22 - 06/20/22    Authorization - Visit Number 8    Authorization - Number of Visits 11    PT Start Time 1457    PT Stop Time 1530    PT Time Calculation (min) 33 min    Activity Tolerance Patient tolerated treatment well    Behavior During Therapy [facility] for tasks assessed/performed                   Past Medical History:  Diagnosis Date   Anemia    Headache    Past Surgical History:  Procedure Laterality Date   BICEPT TENODESIS Right 02/03/2022   Procedure: BICEPS TENODESIS;  Surgeon: Cammy Copa, MD;  Location: [facility] OR;  Service: Orthopedics;  Laterality: Right;   HAND DEBRIDEMENT Right    SHOULDER ARTHROSCOPY WITH ROTATOR CUFF REPAIR AND SUBACROMIAL DECOMPRESSION Right 02/03/2022   Procedure: RIGHT SHOULDER ARTHROSCOPY, DEBRIDEMENT, MINI OPEN ROTATOR CUFF TEAR REPAIR;  Surgeon: Cammy Copa, MD;  Location: [facility] OR;  Service: Orthopedics;  Laterality: Right;   Patient Active Problem List   Diagnosis Date Noted   Traumatic complete tear of right rotator cuff 02/06/2022   Subluxation of tendon of long head of biceps 02/06/2022   Labral tear of shoulder, right, sequela 11/16/2021   Adhesive capsulitis of right shoulder 11/16/2021   Shoulder dislocation, right, initial encounter 09/08/2021   Indication for care in labor and delivery, antepartum 05/17/2016   Encounter for supervision of other normal pregnancy, third trimester 05/17/2016    REFERRING DIAG: S/P right rotator cuff repair  THERAPY DIAG:  Chronic right shoulder  pain  Muscle weakness (generalized)  Rationale for Evaluation and Treatment Rehabilitation  PERTINENT HISTORY: DOS 02/03/2022, see above  PRECAUTIONS: Shoulder    SUBJECTIVE:                                                                                                                                                                                     SUBJECTIVE STATEMENT:  Patient reports her shoulder feels good.  PAIN:  Are you having pain? No   OBJECTIVE: (objective measures completed at initial evaluation unless otherwise dated) PATIENT SURVEYS:  Quick Dash 34.1% disability  POSTURE:  Rounded shoulder posture   UPPER EXTREMITY ROM:    Active ROM Right eval Left eval Rt 04/01/22 Rt 04/07/22 Rt 04/15/22  04/18/22  Shoulder flexion 90 AROM 110 PROM 165 AROM 130 PROM 140 PROM 140 PROM 105 AROM 140 PROM 78 AROM without shrug  Shoulder extension          Shoulder abduction 45 AROM 70 PROM      96 PROM  Shoulder adduction          Shoulder internal rotation       L1   Shoulder external rotation 40 PROM      55 PROM   Elbow flexion          Elbow extension          Wrist flexion          Wrist extension          Wrist ulnar deviation          Wrist radial deviation          Wrist pronation          Wrist supination          (Blank rows = not tested)   Patient demonstrates significant shrug with active shoulder elevation   UPPER EXTREMITY MMT:   MMT Right 04/28/22  Shoulder flexion 2   Shoulder extension    Shoulder abduction 2   Shoulder adduction    Shoulder internal rotation    Shoulder external rotation  3  Middle trapezius    Lower trapezius    Elbow flexion    Elbow extension    Wrist flexion    Wrist extension    Wrist ulnar deviation    Wrist radial deviation    Wrist pronation    Wrist supination    Grip strength (lbs)    (Blank rows = not tested)   JOINT MOBILITY TESTING:  Not assessed   PALPATION:  Non tender to palpation                TODAY'S TREATMENT:       [facility] Adult PT Treatment:                                                DATE: 04/28/22 Therapeutic Exercise: UBE L3 x 4 min (fwd/bwd) while taking subjective Sidelying shoulder ER with 3# 2 x 15 Sidelying shoulder abduction with 2# 2 x 10 Wall physioball roll-up x 10 Incline 50 deg shoulder flexion x 10, with 1# 2 x 10 Standing ER / IR with yellow 2 x 10 each Row with yellow 2 x 10 Standing scaption to 90 deg 2 x 10   [facility] Adult PT Treatment:                                                DATE: 04/26/22 Therapeutic Exercise: UBE L1 x 4 min (fwd/bwd) while taking subjective Supine shoulder flexion with 2# 2 x 10 Supine serratus punch with 2# 2 x 10 Sidelying shoulder abduction with 2# 2 x 10 Sidelying shoulder ER with 2# 2 x 15 Wall physioball roll-up x 10 Incline 50 deg shoulder flexion x 10, with 1# x 10 Standing scaption to 90  deg x 10 Manual Therapy: Right GHJ mobs at various ranges directed at inferior and posterior directions Right shoulder PROM all directions  [facility] Adult PT Treatment:                                                DATE: 04/22/22 Therapeutic Exercise: Supine shoulder flexion AROM 1 x 10 Sidelying shoulder abduction AROM 2 x 10; 2nd round with 1 lb (focusing shoulder position) Incline 45 degrees, dowel pullovers , AAROM ER  Prone row with 2# 2 x 10 Prone shoulder Ext AROM x 10  2# x 10 Standing scaption bilateral -shoulder height with focus on scap depression 5 x 2  Manual Therapy: GHJ mobs , PROM   PATIENT EDUCATION: Education details: HEP update Person educated: Patient Education method: Explanation, Demonstration, Tactile cues, Verbal cues, Handout Education comprehension: verbalized understanding, returned demonstration, verbal cues required, tactile cues required, and needs further education   HOME EXERCISE PROGRAM: Access Code: [email or web address]      ASSESSMENT: CLINICAL IMPRESSION: Patient tolerated therapy well with no  adverse effects. Therapy focused on progressing shoulder motion and strength with good tolerance. She does continue to exhibit shrug with shoulder elevation greater than 90 deg against gravity so worked in gravity reduced position with light weights to move through full range with better form. She does exhibit gross strength deficits of the right shoulder as expected and updated her HEP to progress strengthening at home. Patient would benefit from continued skilled PT to progress her mobility and active motion to maximize her functional ability.    OBJECTIVE IMPAIRMENTS: decreased activity tolerance, decreased ROM, decreased strength, postural dysfunction, and pain.    ACTIVITY LIMITATIONS: carrying, lifting, bathing, dressing, reach over head, and hygiene/grooming   PARTICIPATION LIMITATIONS: meal prep, cleaning, driving, shopping, and occupation   PERSONAL FACTORS: Fitness, Past/current experiences, and Time since onset of injury/illness/exacerbation are also affecting patient's functional outcome.      GOALS: Goals reviewed with patient? Yes   SHORT TERM GOALS: Target date: 04/18/2022   Patient will be I with initial HEP in order to progress with therapy. Baseline: HEP provided at eval Goal status: met    2.  Patient will demonstrate right shoulder PROM grossly WFL in order to indicate improved right shoulder mobility Baseline: patient demonstrates limitations with right shoulder PROM in all planes (see above) Goal status: progressing    3.  Patient will demonstrate right shoulder AROM elevation to 90 deg without shrug compensation to improve shoulder mechanics with raising arm and reduce shoulder pain with bathing and dressing  Baseline: patient demonstrates significant shrug with active shoulder elevation Goal status: progressing    LONG TERM GOALS: Target date: 05/16/2022   Patient will be I with final HEP to maintain progress from PT. Baseline: HEP provided at eval Goal status:  INITIAL   2.  Patient will report QuickDASH </= 10% disability in order to indicate improved functional ability using the right arm Baseline: 34.1% disability Goal status: INITIAL   3.  Patient will demonstrate right shoulder AROM grossly WFL in order to normalize overhead reach and allow for ability to perform light household tasks without limitation  Baseline: patient demonstrates limitations in right shoulder AROM in all planes (see above) Goal status: INITIAL   4.  Patient will demonstrate right rotator cuff strength >/= 4/5 MMT in order to improve lifting,  carrying, and performing heavier household tasks without pain or limitation Baseline: not assessed at eval Goal status: INITIAL     PLAN: PT FREQUENCY: 1-2x/week   PT DURATION: 8 weeks   PLANNED INTERVENTIONS: Therapeutic exercises, Therapeutic activity, Neuromuscular re-education, Balance training, Gait training, Patient/Family education, Self Care, Joint mobilization, Joint manipulation, Aquatic Therapy, Dry Needling, Spinal manipulation, Spinal mobilization, Cryotherapy, Moist heat, Taping, Manual therapy, and Re-evaluation   PLAN FOR NEXT SESSION:  review HEP and progress PRN, manual/PROM for right shoulder motion, AAROM for all planes of shoulder motion, progress AROM supine/inclined/upright for elevation     Rosana Hoes, PT, DPT, LAT, ATC 04/28/22  3:31 PM Phone: [phone] Fax: [phone]

## 2022-04-28 ENCOUNTER — Other Ambulatory Visit: Payer: Self-pay

## 2022-04-28 ENCOUNTER — Encounter: Payer: Self-pay | Admitting: Physical Therapy

## 2022-04-28 ENCOUNTER — Ambulatory Visit: Payer: Medicaid Other | Admitting: Physical Therapy

## 2022-04-28 DIAGNOSIS — G8929 Other chronic pain: Secondary | ICD-10-CM | POA: Diagnosis not present

## 2022-04-28 DIAGNOSIS — M6281 Muscle weakness (generalized): Secondary | ICD-10-CM

## 2022-04-28 DIAGNOSIS — M25511 Pain in right shoulder: Secondary | ICD-10-CM | POA: Diagnosis not present

## 2022-04-28 NOTE — Patient Instructions (Signed)
Access Code: [email or web address] URL: [email or web address] Date: 04/28/2022 Prepared by: Rosana Hoes  Exercises - Supine Shoulder Flexion Extension AAROM with Dowel  - 2 x daily - 2 sets - 10 reps - Supine Shoulder External Rotation with Dowel  - 2 x daily - 10 reps - 5 seconds hold - Sidelying Shoulder External Rotation  - 1 x daily - 2 sets - 10 reps - Sidelying Shoulder Abduction Palm Forward  - 1 x daily - 2 sets - 10 reps - Seated Shoulder Flexion Towel Slide at Table Top  - 2 x daily - 10 reps - 10 seconds hold - Standing Shoulder External Rotation Stretch in Doorway  - 2 x daily - 10 reps - 10 seconds hold - Standing shoulder flexion wall slides  - 2 x daily - 10 reps - Standing Shoulder Scaption  - 1 x daily - 2 sets - 10 reps - Shoulder External Rotation with Anchored Resistance  - 1 x daily - 2 sets - 15 reps - Shoulder Internal Rotation with Resistance  - 1 x daily - 2 sets - 15 reps - Standing Single Arm Row with Resistance Thumb Up  - 1 x daily - 2 sets - 15 reps

## 2022-05-09 ENCOUNTER — Encounter: Payer: Self-pay | Admitting: Physical Therapy

## 2022-05-09 ENCOUNTER — Ambulatory Visit: Payer: Medicaid Other | Admitting: Physical Therapy

## 2022-05-09 DIAGNOSIS — G8929 Other chronic pain: Secondary | ICD-10-CM

## 2022-05-09 DIAGNOSIS — M6281 Muscle weakness (generalized): Secondary | ICD-10-CM | POA: Diagnosis not present

## 2022-05-09 DIAGNOSIS — M25511 Pain in right shoulder: Secondary | ICD-10-CM | POA: Diagnosis not present

## 2022-05-09 NOTE — Therapy (Signed)
OUTPATIENT PHYSICAL THERAPY TREATMENT NOTE   Patient Name: Theresa Banks MRN: [id number] DOB:1980-08-13, 42 y.o., female Today's Date: 05/09/2022  PCP: None listed REFERRING PROVIDER: Cammy Copa, MD   END OF SESSION:   PT End of Session - 05/09/22 0918     Visit Number 10    Number of Visits 17    Date for PT Re-Evaluation 05/16/22    Authorization Type [employer]    Authorization Time Period 03/23/22 - 06/20/22    Authorization - Visit Number 9    Authorization - Number of Visits 11    PT Start Time 0850    PT Stop Time 0930    PT Time Calculation (min) 40 min                    Past Medical History:  Diagnosis Date   Anemia    Headache    Past Surgical History:  Procedure Laterality Date   BICEPT TENODESIS Right 02/03/2022   Procedure: BICEPS TENODESIS;  Surgeon: Cammy Copa, MD;  Location: [facility] OR;  Service: Orthopedics;  Laterality: Right;   HAND DEBRIDEMENT Right    SHOULDER ARTHROSCOPY WITH ROTATOR CUFF REPAIR AND SUBACROMIAL DECOMPRESSION Right 02/03/2022   Procedure: RIGHT SHOULDER ARTHROSCOPY, DEBRIDEMENT, MINI OPEN ROTATOR CUFF TEAR REPAIR;  Surgeon: Cammy Copa, MD;  Location: [facility] OR;  Service: Orthopedics;  Laterality: Right;   Patient Active Problem List   Diagnosis Date Noted   Traumatic complete tear of right rotator cuff 02/06/2022   Subluxation of tendon of long head of biceps 02/06/2022   Labral tear of shoulder, right, sequela 11/16/2021   Adhesive capsulitis of right shoulder 11/16/2021   Shoulder dislocation, right, initial encounter 09/08/2021   Indication for care in labor and delivery, antepartum 05/17/2016   Encounter for supervision of other normal pregnancy, third trimester 05/17/2016    REFERRING DIAG: S/P right rotator cuff repair  THERAPY DIAG:  Muscle weakness (generalized)  Chronic right shoulder pain  Rationale for Evaluation and Treatment Rehabilitation  PERTINENT HISTORY: DOS  02/03/2022, see above  PRECAUTIONS: Shoulder    SUBJECTIVE:                                                                                                                                                                                     SUBJECTIVE STATEMENT:  I cannot scratch my back the way I want to. I haven't had any issues with other ADLs. No pain.   PAIN:  Are you having pain? No   OBJECTIVE: (objective measures completed at initial evaluation unless otherwise dated) PATIENT SURVEYS:  Quick Dash 34.1% disability 05/09/22 QUICK DASH 18% disability  POSTURE: Rounded shoulder posture   UPPER EXTREMITY ROM:    Active ROM Right eval Left eval Rt 04/01/22 Rt 04/07/22 Rt 04/15/22  04/18/22 05/09/22 AROM/PROM  Shoulder flexion 90 AROM 110 PROM 165 AROM 130 PROM 140 PROM 140 PROM 105 AROM 140 PROM 78 AROM without shrug 130/148  Shoulder extension           Shoulder abduction 45 AROM 70 PROM      96 PROM 122/130  Shoulder adduction           Shoulder internal rotation       L1  Reach L1/50  Shoulder external rotation 40 PROM      55 PROM  Reach T2/60  Elbow flexion           Elbow extension           Wrist flexion           Wrist extension           Wrist ulnar deviation           Wrist radial deviation           Wrist pronation           Wrist supination           (Blank rows = not tested)   Patient demonstrates significant shrug with active shoulder elevation   UPPER EXTREMITY MMT:   MMT Right 04/28/22  Shoulder flexion 2   Shoulder extension    Shoulder abduction 2   Shoulder adduction    Shoulder internal rotation    Shoulder external rotation  3  Middle trapezius    Lower trapezius    Elbow flexion    Elbow extension    Wrist flexion    Wrist extension    Wrist ulnar deviation    Wrist radial deviation    Wrist pronation    Wrist supination    Grip strength (lbs)    (Blank rows = not tested)   JOINT MOBILITY TESTING:  Not assessed    PALPATION:  Non tender to palpation               TODAY'S TREATMENT:       [facility] Adult PT Treatment:                                                DATE: 05/09/22 Therapeutic Exercise: AROM Lower trap wall slide to lift off - tactile cues for shoulder hike Mirror feedback bilateral shoulder flexion-working on decreasing shoulder hike- used wall and PPT to reduce lumbar extension.  Yellow band row, IR, ER- pt required cues to correctly perform ER x 10-15 reps each Standing AAROM shoulder ext with dowel Standing AAROM shoulder IR with dowel.  Manual Therapy: PROM flex, abdct IR and ER in scap plane   [facility] Adult PT Treatment:                                                DATE: 04/28/22 Therapeutic Exercise: UBE L3 x 4 min (fwd/bwd) while taking subjective Sidelying shoulder ER with 3# 2 x 15 Sidelying shoulder abduction with 2# 2 x 10 Wall physioball roll-up x 10 Incline 50 deg shoulder flexion x  10, with 1# 2 x 10 Standing ER / IR with yellow 2 x 10 each Row with yellow 2 x 10 Standing scaption to 90 deg 2 x 10   [facility] Adult PT Treatment:                                                DATE: 04/26/22 Therapeutic Exercise: UBE L1 x 4 min (fwd/bwd) while taking subjective Supine shoulder flexion with 2# 2 x 10 Supine serratus punch with 2# 2 x 10 Sidelying shoulder abduction with 2# 2 x 10 Sidelying shoulder ER with 2# 2 x 15 Wall physioball roll-up x 10 Incline 50 deg shoulder flexion x 10, with 1# x 10 Standing scaption to 90 deg x 10 Manual Therapy: Right GHJ mobs at various ranges directed at inferior and posterior directions Right shoulder PROM all directions  [facility] Adult PT Treatment:                                                DATE: 04/22/22 Therapeutic Exercise: Supine shoulder flexion AROM 1 x 10 Sidelying shoulder abduction AROM 2 x 10; 2nd round with 1 lb (focusing shoulder position) Incline 45 degrees, dowel pullovers , AAROM ER  Prone row with 2# 2 x 10 Prone  shoulder Ext AROM x 10  2# x 10 Standing scaption bilateral -shoulder height with focus on scap depression 5 x 2  Manual Therapy: GHJ mobs , PROM   PATIENT EDUCATION: Education details: HEP update Person educated: Patient Education method: Explanation, Demonstration, Tactile cues, Verbal cues, Handout Education comprehension: verbalized understanding, returned demonstration, verbal cues required, tactile cues required, and needs further education   HOME EXERCISE PROGRAM: Access Code: [email or web address]      ASSESSMENT: CLINICAL IMPRESSION: Patient tolerated therapy well with no adverse effects. Therapy focused on progressing shoulder motion and strength with good tolerance. She does continue to exhibit shrug with shoulder elevation greater than 90 deg against gravity so used mirror for feedback which improved her ability to reach 90 degrees without hike. She does exhibit gross strength deficits of the right shoulder as expected and reviewed her HEP which she was found to need cues for correct form on resisted shoulder ER. Patient would benefit from continued skilled PT to progress her mobility and active motion to maximize her functional ability. Quick Dash improved.    OBJECTIVE IMPAIRMENTS: decreased activity tolerance, decreased ROM, decreased strength, postural dysfunction, and pain.    ACTIVITY LIMITATIONS: carrying, lifting, bathing, dressing, reach over head, and hygiene/grooming   PARTICIPATION LIMITATIONS: meal prep, cleaning, driving, shopping, and occupation   PERSONAL FACTORS: Fitness, Past/current experiences, and Time since onset of injury/illness/exacerbation are also affecting patient's functional outcome.      GOALS: Goals reviewed with patient? Yes   SHORT TERM GOALS: Target date: 04/18/2022   Patient will be I with initial HEP in order to progress with therapy. Baseline: HEP provided at eval Goal status: met    2.  Patient will demonstrate right shoulder PROM grossly WFL  in order to indicate improved right shoulder mobility Baseline: patient demonstrates limitations with right shoulder PROM in all planes (see above) 05/09/22: improved : see chart Goal status: progressing    3.  Patient will  demonstrate right shoulder AROM elevation to 90 deg without shrug compensation to improve shoulder mechanics with raising arm and reduce shoulder pain with bathing and dressing  Baseline: patient demonstrates significant shrug with active shoulder elevation 05/09/22: can perform with mirror feedback Goal status: progressing    LONG TERM GOALS: Target date: 05/16/2022   Patient will be I with final HEP to maintain progress from PT. Baseline: HEP provided at eval Goal status: INITIAL   2.  Patient will report QuickDASH </= 10% disability in order to indicate improved functional ability using the right arm Baseline: 34.1% disability 05/09/22: 18%  Goal status: ONGOING   3.  Patient will demonstrate right shoulder AROM grossly WFL in order to normalize overhead reach and allow for ability to perform light household tasks without limitation  Baseline: patient demonstrates limitations in right shoulder AROM in all planes (see above) Goal status: INITIAL   4.  Patient will demonstrate right rotator cuff strength >/= 4/5 MMT in order to improve lifting, carrying, and performing heavier household tasks without pain or limitation Baseline: not assessed at eval Goal status: INITIAL     PLAN: PT FREQUENCY: 1-2x/week   PT DURATION: 8 weeks   PLANNED INTERVENTIONS: Therapeutic exercises, Therapeutic activity, Neuromuscular re-education, Balance training, Gait training, Patient/Family education, Self Care, Joint mobilization, Joint manipulation, Aquatic Therapy, Dry Needling, Spinal manipulation, Spinal mobilization, Cryotherapy, Moist heat, Taping, Manual therapy, and Re-evaluation   PLAN FOR NEXT SESSION:  review HEP and progress PRN, manual/PROM for right shoulder motion,  AAROM for all planes of shoulder motion, progress AROM supine/inclined/upright for elevation     Jannette Spanner, PTA 05/09/22 11:36 AM Phone: [phone] Fax: [phone]

## 2022-05-12 ENCOUNTER — Ambulatory Visit: Payer: Medicaid Other | Admitting: Physical Therapy

## 2022-05-17 NOTE — Therapy (Signed)
OUTPATIENT PHYSICAL THERAPY TREATMENT NOTE/RE-CERTIFICATION   Patient Name: Theresa Banks MRN: [id number] DOB:[birth year], 42 y.o., female Today's Date: 05/18/2022  PCP: None listed REFERRING PROVIDER: Cammy Copa, MD   END OF SESSION:   PT End of Session - 05/18/22 0858     Visit Number 11    Number of Visits 23    Date for PT Re-Evaluation 07/02/22    Authorization Type [employer]    Authorization Time Period 03/23/22 - 06/20/22    Authorization - Visit Number 10    Authorization - Number of Visits 11    PT Start Time 0858   patient late   PT Stop Time 0929    PT Time Calculation (min) 31 min    Activity Tolerance Patient tolerated treatment well    Behavior During Therapy [facility] for tasks assessed/performed                     Past Medical History:  Diagnosis Date   Anemia    Headache    Past Surgical History:  Procedure Laterality Date   BICEPT TENODESIS Right 02/03/2022   Procedure: BICEPS TENODESIS;  Surgeon: Cammy Copa, MD;  Location: [facility] OR;  Service: Orthopedics;  Laterality: Right;   HAND DEBRIDEMENT Right    SHOULDER ARTHROSCOPY WITH ROTATOR CUFF REPAIR AND SUBACROMIAL DECOMPRESSION Right 02/03/2022   Procedure: RIGHT SHOULDER ARTHROSCOPY, DEBRIDEMENT, MINI OPEN ROTATOR CUFF TEAR REPAIR;  Surgeon: Cammy Copa, MD;  Location: [facility] OR;  Service: Orthopedics;  Laterality: Right;   Patient Active Problem List   Diagnosis Date Noted   Traumatic complete tear of right rotator cuff 02/06/2022   Subluxation of tendon of long head of biceps 02/06/2022   Labral tear of shoulder, right, sequela 11/16/2021   Adhesive capsulitis of right shoulder 11/16/2021   Shoulder dislocation, right, initial encounter 09/08/2021   Indication for care in labor and delivery, antepartum 05/17/2016   Encounter for supervision of other normal pregnancy, third trimester 05/17/2016    REFERRING DIAG: S/P right rotator cuff repair  THERAPY  DIAG:  Muscle weakness (generalized)  Chronic right shoulder pain  Rationale for Evaluation and Treatment Rehabilitation  PERTINENT HISTORY: DOS 02/03/2022, see above  PRECAUTIONS: Shoulder    SUBJECTIVE:                                                                                                                                                                                     SUBJECTIVE STATEMENT:  "It feels ok. My shoulder still shrugs, but I don't have the pain."   PAIN:  Are you having pain? No   OBJECTIVE: (objective measures completed at initial  evaluation unless otherwise dated) PATIENT SURVEYS:  Quick Dash 34.1% disability 05/09/22 QUICK DASH 18% disability  05/18/22: Quick DASH: 4.5% disability    POSTURE: Rounded shoulder posture   UPPER EXTREMITY ROM:    Active ROM Right eval Left eval Rt 04/01/22 Rt 04/07/22 Rt 04/15/22  04/18/22 05/09/22 AROM/PROM 05/18/22 AROM/PROM  Shoulder flexion 90 AROM 110 PROM 165 AROM 130 PROM 140 PROM 140 PROM 105 AROM 140 PROM 78 AROM without shrug 130/148 172 (supine); 175; AROM in standing shoulder shrug present past 120   Shoulder extension            Shoulder abduction 45 AROM 70 PROM      96 PROM 122/130 140 (supine)/170  Shoulder adduction            Shoulder internal rotation       L1  Reach L1/50 55; 70  Shoulder external rotation 40 PROM      55 PROM  Reach T2/60 60; 71  Elbow flexion            Elbow extension            Wrist flexion            Wrist extension            Wrist ulnar deviation            Wrist radial deviation            Wrist pronation            Wrist supination            (Blank rows = not tested)   Patient demonstrates significant shrug with active shoulder elevation   UPPER EXTREMITY MMT:   MMT Right 04/28/22 05/18/22 Right   Shoulder flexion 2  3-  Shoulder extension     Shoulder abduction 2  3-  Shoulder adduction     Shoulder internal rotation   5  Shoulder external rotation  3  4-  Middle trapezius     Lower trapezius     Elbow flexion     Elbow extension     Wrist flexion     Wrist extension     Wrist ulnar deviation     Wrist radial deviation     Wrist pronation     Wrist supination     Grip strength (lbs)     (Blank rows = not tested)   JOINT MOBILITY TESTING:  Not assessed   PALPATION:  Non tender to palpation               TODAY'S TREATMENT:       [facility] Adult PT Treatment:                                                DATE: 05/18/22 Therapeutic Exercise: UBE level 3 x 2 min each fwd/bwd Sleeper stretch 2 x 30 sec Seated shoulder flexion AROM to 120 degrees x 10  Shoulder flexion inclined 50 degrees 2# 2 x 10  Resisted shoulder ER yellow band 2 x 10    Therapeutic Activity: Re-assessment to determine overall progress, educating patient on progress towards goals.     [facility] Adult PT Treatment:  DATE: 05/09/22 Therapeutic Exercise: AROM Lower trap wall slide to lift off - tactile cues for shoulder hike Mirror feedback bilateral shoulder flexion-working on decreasing shoulder hike- used wall and PPT to reduce lumbar extension.  Yellow band row, IR, ER- pt required cues to correctly perform ER x 10-15 reps each Standing AAROM shoulder ext with dowel Standing AAROM shoulder IR with dowel.  Manual Therapy: PROM flex, abdct IR and ER in scap plane   [facility] Adult PT Treatment:                                                DATE: 04/28/22 Therapeutic Exercise: UBE L3 x 4 min (fwd/bwd) while taking subjective Sidelying shoulder ER with 3# 2 x 15 Sidelying shoulder abduction with 2# 2 x 10 Wall physioball roll-up x 10 Incline 50 deg shoulder flexion x 10, with 1# 2 x 10 Standing ER / IR with yellow 2 x 10 each Row with yellow 2 x 10 Standing scaption to 90 deg 2 x 10   [facility] Adult PT Treatment:                                                DATE: 04/26/22 Therapeutic Exercise: UBE L1 x 4 min  (fwd/bwd) while taking subjective Supine shoulder flexion with 2# 2 x 10 Supine serratus punch with 2# 2 x 10 Sidelying shoulder abduction with 2# 2 x 10 Sidelying shoulder ER with 2# 2 x 15 Wall physioball roll-up x 10 Incline 50 deg shoulder flexion x 10, with 1# x 10 Standing scaption to 90 deg x 10 Manual Therapy: Right GHJ mobs at various ranges directed at inferior and posterior directions Right shoulder PROM all directions  PATIENT EDUCATION: Education details: see treatment; POC Person educated: Patient Education method: Explanation Education comprehension: verbalized understanding   HOME EXERCISE PROGRAM: Access Code: [email or web address]      ASSESSMENT: CLINICAL IMPRESSION: Patient is making steady progress in PT s/p Rt rotator cuff repair on 02/03/22. She has met all STG and 1/4 LTG. Her Rt shoulder ROM and strength is improving. She continues to remain slightly limited in all planes of AROM most notable in IR. She exhibits compensatory shoulder shrug with elevation >120 degrees indicative of ongoing weakness as well as rotator cuff weakness. She will benefit from continued skilled PT 1-2x/week for 6 additional weeks to address lingering strength and ROM deficits in order to optimize her function.   OBJECTIVE IMPAIRMENTS: decreased activity tolerance, decreased ROM, decreased strength, postural dysfunction, and pain.    ACTIVITY LIMITATIONS: carrying, lifting, bathing, dressing, reach over head, and hygiene/grooming   PARTICIPATION LIMITATIONS: meal prep, cleaning, driving, shopping, and occupation   PERSONAL FACTORS: Fitness, Past/current experiences, and Time since onset of injury/illness/exacerbation are also affecting patient's functional outcome.      GOALS: Goals reviewed with patient? Yes   SHORT TERM GOALS: Target date: 04/18/2022   Patient will be I with initial HEP in order to progress with therapy. Baseline: HEP provided at eval Goal status: met    2.  Patient  will demonstrate right shoulder PROM grossly WFL in order to indicate improved right shoulder mobility Baseline: patient demonstrates limitations with right shoulder PROM in all planes (see above) 05/09/22:  improved : see chart Goal status: met    3.  Patient will demonstrate right shoulder AROM elevation to 90 deg without shrug compensation to improve shoulder mechanics with raising arm and reduce shoulder pain with bathing and dressing  Baseline: patient demonstrates significant shrug with active shoulder elevation 05/09/22: can perform with mirror feedback Goal status: met    LONG TERM GOALS: Target date: 07/02/22   Patient will be I with final HEP to maintain progress from PT. Baseline: HEP provided at eval Goal status: ongoing    2.  Patient will report QuickDASH </= 10% disability in order to indicate improved functional ability using the right arm Baseline: 34.1% disability 05/09/22: 18%  Goal status: met   3.  Patient will demonstrate right shoulder AROM grossly WFL in order to normalize overhead reach and allow for ability to perform light household tasks without limitation  Baseline: patient demonstrates limitations in right shoulder AROM in all planes (see above) Goal status: progressing    4.  Patient will demonstrate right rotator cuff strength >/= 4/5 MMT in order to improve lifting, carrying, and performing heavier household tasks without pain or limitation Baseline: not assessed at eval Goal status: partially met      PLAN: PT FREQUENCY: 1-2x/week   PT DURATION: 6 weeks    PLANNED INTERVENTIONS: Therapeutic exercises, Therapeutic activity, Neuromuscular re-education, Balance training, Gait training, Patient/Family education, Self Care, Joint mobilization, Joint manipulation, Aquatic Therapy, Dry Needling, Spinal manipulation, Spinal mobilization, Cryotherapy, Moist heat, Taping, Manual therapy, and Re-evaluation   PLAN FOR NEXT SESSION:  review HEP and progress PRN,  manual/PROM for right shoulder motion, AAROM for all planes of shoulder motion, progress AROM supine/inclined/upright for elevation    Letitia Libra, PT, DPT, ATC 05/18/22 9:30 AM  Check all possible CPT codes: 54098 - PT Re-evaluation, 97110- Therapeutic Exercise, [phone]- Neuro Re-education, 97140 - Manual Therapy, 97530 - Therapeutic Activities, 97535 - Self Care, and [phone] - Aquatic therapy                           Check all conditions that are expected to impact treatment: None of these apply      If treatment provided at initial evaluation, no treatment charged due to lack of authorization.

## 2022-05-18 ENCOUNTER — Ambulatory Visit: Payer: Medicaid Other | Attending: Orthopedic Surgery

## 2022-05-18 DIAGNOSIS — G8929 Other chronic pain: Secondary | ICD-10-CM | POA: Insufficient documentation

## 2022-05-18 DIAGNOSIS — M25511 Pain in right shoulder: Secondary | ICD-10-CM | POA: Insufficient documentation

## 2022-05-18 DIAGNOSIS — M6281 Muscle weakness (generalized): Secondary | ICD-10-CM | POA: Diagnosis not present

## 2022-05-20 ENCOUNTER — Encounter: Payer: Self-pay | Admitting: Physical Therapy

## 2022-05-20 ENCOUNTER — Ambulatory Visit: Payer: Medicaid Other | Admitting: Physical Therapy

## 2022-05-20 ENCOUNTER — Other Ambulatory Visit: Payer: Self-pay

## 2022-05-20 DIAGNOSIS — M6281 Muscle weakness (generalized): Secondary | ICD-10-CM | POA: Diagnosis not present

## 2022-05-20 DIAGNOSIS — G8929 Other chronic pain: Secondary | ICD-10-CM | POA: Diagnosis not present

## 2022-05-20 DIAGNOSIS — M25511 Pain in right shoulder: Secondary | ICD-10-CM | POA: Diagnosis not present

## 2022-05-20 NOTE — Therapy (Signed)
OUTPATIENT PHYSICAL THERAPY TREATMENT   Patient Name: Theresa Banks MRN: [id number] DOB:July 05, 1980, 42 y.o., female Today's Date: 05/20/2022  PCP: None listed REFERRING PROVIDER: Cammy Copa, MD   END OF SESSION:   PT End of Session - 05/20/22 0903     Visit Number 12    Number of Visits 23    Date for PT Re-Evaluation 07/02/22    Authorization Type [employer]    Authorization Time Period 03/23/22 - 06/20/22    Authorization - Visit Number 11    Authorization - Number of Visits 11    PT Start Time 0900   patient arrived late   PT Stop Time 0930    PT Time Calculation (min) 30 min    Activity Tolerance Patient tolerated treatment well    Behavior During Therapy [facility] for tasks assessed/performed                      Past Medical History:  Diagnosis Date   Anemia    Headache    Past Surgical History:  Procedure Laterality Date   BICEPT TENODESIS Right 02/03/2022   Procedure: BICEPS TENODESIS;  Surgeon: Cammy Copa, MD;  Location: [facility] OR;  Service: Orthopedics;  Laterality: Right;   HAND DEBRIDEMENT Right    SHOULDER ARTHROSCOPY WITH ROTATOR CUFF REPAIR AND SUBACROMIAL DECOMPRESSION Right 02/03/2022   Procedure: RIGHT SHOULDER ARTHROSCOPY, DEBRIDEMENT, MINI OPEN ROTATOR CUFF TEAR REPAIR;  Surgeon: Cammy Copa, MD;  Location: [facility] OR;  Service: Orthopedics;  Laterality: Right;   Patient Active Problem List   Diagnosis Date Noted   Traumatic complete tear of right rotator cuff 02/06/2022   Subluxation of tendon of long head of biceps 02/06/2022   Labral tear of shoulder, right, sequela 11/16/2021   Adhesive capsulitis of right shoulder 11/16/2021   Shoulder dislocation, right, initial encounter 09/08/2021   Indication for care in labor and delivery, antepartum 05/17/2016   Encounter for supervision of other normal pregnancy, third trimester 05/17/2016    REFERRING DIAG: S/P right rotator cuff repair  THERAPY DIAG:   Chronic right shoulder pain  Muscle weakness (generalized)  Rationale for Evaluation and Treatment Rehabilitation  PERTINENT HISTORY: DOS 02/03/2022, see above  PRECAUTIONS: Shoulder    SUBJECTIVE:                                                                                                                                                                                     SUBJECTIVE STATEMENT:  Patient reports she is doing well. She remains consistent with her exercises.  PAIN:  Are you having pain? No   OBJECTIVE: (objective measures completed at initial evaluation  unless otherwise dated) PATIENT SURVEYS:  Quick Dash 34.1% disability 05/09/22 QUICK DASH 18% disability 05/18/22: Quick DASH: 4.5% disability    POSTURE: Rounded shoulder posture   UPPER EXTREMITY ROM:    Active ROM Right eval Left eval Rt 04/01/22 Rt 04/07/22 Rt 04/15/22  04/18/22 05/09/22 AROM/PROM 05/18/22 AROM/PROM Rt 05/20/2022  Shoulder flexion 90 AROM 110 PROM 165 AROM 130 PROM 140 PROM 140 PROM 105 AROM 140 PROM 78 AROM without shrug 130/148 172 (supine); 175; AROM in standing shoulder shrug present past 120  130 Standing (shrug > 120)  Shoulder extension             Shoulder abduction 45 AROM 70 PROM      96 PROM 122/130 140 (supine)/170 120 standing  Shoulder adduction             Shoulder internal rotation       L1  Reach L1/50 55; 70   Shoulder external rotation 40 PROM      55 PROM  Reach T2/60 60; 71 60  Elbow flexion             Elbow extension             Wrist flexion             Wrist extension             Wrist ulnar deviation             Wrist radial deviation             Wrist pronation             Wrist supination             (Blank rows = not tested)   Patient demonstrates significant shrug with active shoulder elevation   UPPER EXTREMITY MMT:   MMT Right 04/28/22 05/18/22 Right  Right 05/20/2022  Shoulder flexion 2  3- 3-  Shoulder extension      Shoulder abduction 2   3- 3-  Shoulder adduction      Shoulder internal rotation   5   Shoulder external rotation  3 4- 4-  Middle trapezius      Lower trapezius      Elbow flexion      Elbow extension      Wrist flexion      Wrist extension      Wrist ulnar deviation      Wrist radial deviation      Wrist pronation      Wrist supination      Grip strength (lbs)      (Blank rows = not tested)   JOINT MOBILITY TESTING:  Not assessed   PALPATION:  Non tender to palpation               TODAY'S TREATMENT:       [facility] Adult PT Treatment:                                                DATE: 05/20/22 Therapeutic Exercise: Supine dowel flexion 3# 2 x 10 Supine dowel serratus press 3# 2 x 10 Incline 60 deg shoulder flexion with 1# 2 x 10, with 2# x 10 Sidelying shoulder abduction with 3# 2 x 10 Standing ER / IR with red 2 x 15 each Wall slide x 10 Standing  scaption to 90 deg with 1# 2 x 10 Row with blue 2 x 15   [facility] Adult PT Treatment:                                                DATE: 05/18/22 Therapeutic Exercise: UBE level 3 x 2 min each fwd/bwd Sleeper stretch 2 x 30 sec Seated shoulder flexion AROM to 120 degrees x 10  Shoulder flexion inclined 50 degrees 2# 2 x 10  Resisted shoulder ER yellow band 2 x 10  Therapeutic Activity: Re-assessment to determine overall progress, educating patient on progress towards goals.   [facility] Adult PT Treatment:                                                DATE: 05/09/22 Therapeutic Exercise: AROM Lower trap wall slide to lift off - tactile cues for shoulder hike Mirror feedback bilateral shoulder flexion-working on decreasing shoulder hike- used wall and PPT to reduce lumbar extension.  Yellow band row, IR, ER- pt required cues to correctly perform ER x 10-15 reps each Standing AAROM shoulder ext with dowel Standing AAROM shoulder IR with dowel.  Manual Therapy: PROM flex, abdct IR and ER in scap plane  PATIENT EDUCATION: Education details: HEP Person  educated: Patient Education method: Explanation Education comprehension: verbalized understanding   HOME EXERCISE PROGRAM: Access Code: [id number]      ASSESSMENT: CLINICAL IMPRESSION: Patient tolerated therapy well with no adverse effects. Patient is progressing well in therapy following right rotator cuff repair on 02/03/2022. She does continue to exhibit limitation in right shoulder active motion with compensatory shrug in shoulder elevation greater than 120 deg, and gross strength deficits of the right shoulder contributing to her limitation in motion and impacting her functional ability using the right arm. Therapy continues to focus on progressing her shoulder motion and strength with good tolerance. She does require cueing for shoulder mechanics and avoiding shoulder shrug. Patient would benefit from continued skilled PT to progress her mobility and active motion to maximize her functional ability.    OBJECTIVE IMPAIRMENTS: decreased activity tolerance, decreased ROM, decreased strength, postural dysfunction, and pain.    ACTIVITY LIMITATIONS: carrying, lifting, bathing, dressing, reach over head, and hygiene/grooming   PARTICIPATION LIMITATIONS: meal prep, cleaning, driving, shopping, and occupation   PERSONAL FACTORS: Fitness, Past/current experiences, and Time since onset of injury/illness/exacerbation are also affecting patient's functional outcome.      GOALS: Goals reviewed with patient? Yes   SHORT TERM GOALS: Target date: 04/18/2022   Patient will be I with initial HEP in order to progress with therapy. Baseline: HEP provided at eval Goal status: met    2.  Patient will demonstrate right shoulder PROM grossly WFL in order to indicate improved right shoulder mobility Baseline: patient demonstrates limitations with right shoulder PROM in all planes (see above) 05/09/22: improved : see chart Goal status: met    3.  Patient will demonstrate right shoulder AROM elevation to 90  deg without shrug compensation to improve shoulder mechanics with raising arm and reduce shoulder pain with bathing and dressing  Baseline: patient demonstrates significant shrug with active shoulder elevation 05/09/22: can perform with mirror feedback Goal status: met    LONG  TERM GOALS: Target date: 07/02/22   Patient will be I with final HEP to maintain progress from PT. Baseline: HEP provided at eval 05/20/2022: progressing Goal status: partially met   2.  Patient will report QuickDASH </= 10% disability in order to indicate improved functional ability using the right arm Baseline: 34.1% disability 05/09/22: 18%  05/18/22: Quick DASH: 4.5% disability  Goal status: met   3.  Patient will demonstrate right shoulder AROM grossly WFL in order to normalize overhead reach and allow for ability to perform light household tasks without limitation  Baseline: patient demonstrates limitations in right shoulder AROM in all planes (see above) 05/20/2022:  Goal status: partially met   4.  Patient will demonstrate right rotator cuff strength >/= 4/5 MMT in order to improve lifting, carrying, and performing heavier household tasks without pain or limitation Baseline: not assessed at eval 05/20/2022: grossly 3-/5 MMT Goal status: partially met      PLAN: PT FREQUENCY: 1-2x/week   PT DURATION: 6 weeks    PLANNED INTERVENTIONS: Therapeutic exercises, Therapeutic activity, Neuromuscular re-education, Balance training, Gait training, Patient/Family education, Self Care, Joint mobilization, Joint manipulation, Aquatic Therapy, Dry Needling, Spinal manipulation, Spinal mobilization, Cryotherapy, Moist heat, Taping, Manual therapy, and Re-evaluation   PLAN FOR NEXT SESSION:  review HEP and progress PRN, manual/PROM for right shoulder motion, AAROM for all planes of shoulder motion, progress AROM supine/inclined/upright for elevation    Rosana Hoes, PT, DPT, LAT, ATC 05/20/22  10:21 AM Phone:  [phone] Fax: [phone]         Check all possible CPT codes: 29562 - PT Re-evaluation, 97110- Therapeutic Exercise, 97112- Neuro Re-education, 97140 - Manual Therapy, 97530 - Therapeutic Activities, and 97535 - Self Care    Check all conditions that are expected to impact treatment: {Conditions expected to impact treatment:None of these apply   If treatment provided at initial evaluation, no treatment charged due to lack of authorization.

## 2022-05-20 NOTE — Therapy (Signed)
OUTPATIENT PHYSICAL THERAPY TREATMENT   Patient Name: Theresa Banks MRN: [id number] DOB:Jun 26, 1980, 42 y.o., female Today's Date: 05/23/2022  PCP: None listed REFERRING PROVIDER: Cammy Copa, MD   END OF SESSION:   PT End of Session - 05/23/22 1137     Visit Number 13    Number of Visits 23    Date for PT Re-Evaluation 07/02/22    Authorization Type [employer]    Authorization Time Period 05/20/2022 - 06/18/2022    Authorization - Visit Number 1    Authorization - Number of Visits 4    PT Start Time 1110    PT Stop Time 1150    PT Time Calculation (min) 40 min    Activity Tolerance Patient tolerated treatment well    Behavior During Therapy [facility] for tasks assessed/performed                       Past Medical History:  Diagnosis Date   Anemia    Headache    Past Surgical History:  Procedure Laterality Date   BICEPT TENODESIS Right 02/03/2022   Procedure: BICEPS TENODESIS;  Surgeon: Cammy Copa, MD;  Location: [facility] OR;  Service: Orthopedics;  Laterality: Right;   HAND DEBRIDEMENT Right    SHOULDER ARTHROSCOPY WITH ROTATOR CUFF REPAIR AND SUBACROMIAL DECOMPRESSION Right 02/03/2022   Procedure: RIGHT SHOULDER ARTHROSCOPY, DEBRIDEMENT, MINI OPEN ROTATOR CUFF TEAR REPAIR;  Surgeon: Cammy Copa, MD;  Location: [facility] OR;  Service: Orthopedics;  Laterality: Right;   Patient Active Problem List   Diagnosis Date Noted   Traumatic complete tear of right rotator cuff 02/06/2022   Subluxation of tendon of long head of biceps 02/06/2022   Labral tear of shoulder, right, sequela 11/16/2021   Adhesive capsulitis of right shoulder 11/16/2021   Shoulder dislocation, right, initial encounter 09/08/2021   Indication for care in labor and delivery, antepartum 05/17/2016   Encounter for supervision of other normal pregnancy, third trimester 05/17/2016    REFERRING DIAG: S/P right rotator cuff repair  THERAPY DIAG:  Chronic right shoulder  pain  Muscle weakness (generalized)  Rationale for Evaluation and Treatment Rehabilitation  PERTINENT HISTORY: DOS 02/03/2022, see above  PRECAUTIONS: Shoulder    SUBJECTIVE:                                                                                                                                                                                     SUBJECTIVE STATEMENT:  Patient reports she is doing good. No new issues.   PAIN:  Are you having pain? No   OBJECTIVE: (objective measures completed at initial evaluation unless otherwise dated) PATIENT SURVEYS:  Quick Dash 34.1% disability 05/09/22 QUICK DASH 18% disability 05/18/22: Quick DASH: 4.5% disability    POSTURE: Rounded shoulder posture   UPPER EXTREMITY ROM:    Active ROM Right eval Left eval Rt 04/01/22 Rt 04/07/22 Rt 04/15/22  04/18/22 05/09/22 AROM/PROM 05/18/22 AROM/PROM Rt 05/20/2022  Shoulder flexion 90 AROM 110 PROM 165 AROM 130 PROM 140 PROM 140 PROM 105 AROM 140 PROM 78 AROM without shrug 130/148 172 (supine); 175; AROM in standing shoulder shrug present past 120  130 Standing (shrug > 120)  Shoulder extension             Shoulder abduction 45 AROM 70 PROM      96 PROM 122/130 140 (supine)/170 120 standing  Shoulder adduction             Shoulder internal rotation       L1  Reach L1/50 55; 70   Shoulder external rotation 40 PROM      55 PROM  Reach T2/60 60; 71 60  Elbow flexion             Elbow extension             Wrist flexion             Wrist extension             Wrist ulnar deviation             Wrist radial deviation             Wrist pronation             Wrist supination             (Blank rows = not tested)   Patient demonstrates significant shrug with active shoulder elevation   UPPER EXTREMITY MMT:   MMT Right 04/28/22 05/18/22 Right  Right 05/20/2022  Shoulder flexion 2  3- 3-  Shoulder extension      Shoulder abduction 2  3- 3-  Shoulder adduction      Shoulder internal  rotation   5   Shoulder external rotation  3 4- 4-  Middle trapezius      Lower trapezius      Elbow flexion      Elbow extension      Wrist flexion      Wrist extension      Wrist ulnar deviation      Wrist radial deviation      Wrist pronation      Wrist supination      Grip strength (lbs)      (Blank rows = not tested)   JOINT MOBILITY TESTING:  Not assessed   PALPATION:  Non tender to palpation               TODAY'S TREATMENT:       [facility] Adult PT Treatment:                                                DATE: 05/23/22 Therapeutic Exercise: Incline 60 deg shoulder flexion with x 10, 1# 2 x 10, with 2# x 10 Sidelying shoulder abduction with 3# 2 x 10 Sidelying shoulder ER with 3# 2 x 10 Row with blue 2 x 15 Extension with yellow 2 x 15 Wall slide x 10 Standing ER with red 2 x 15 each Seated ER  at 70 deg abd with elbow propped with 2# 2 x 15 Standing scaption to 90 deg with 1# 2 x 10   [facility] Adult PT Treatment:                                                DATE: 05/20/22 Therapeutic Exercise: Supine dowel flexion 3# 2 x 10 Supine dowel serratus press 3# 2 x 10 Incline 60 deg shoulder flexion with 1# 2 x 10, with 2# x 10 Sidelying shoulder abduction with 3# 2 x 10 Standing ER / IR with red 2 x 15 each Wall slide x 10 Standing scaption to 90 deg with 1# 2 x 10 Row with blue 2 x 15  [facility] Adult PT Treatment:                                                DATE: 05/18/22 Therapeutic Exercise: UBE level 3 x 2 min each fwd/bwd Sleeper stretch 2 x 30 sec Seated shoulder flexion AROM to 120 degrees x 10  Shoulder flexion inclined 50 degrees 2# 2 x 10  Resisted shoulder ER yellow band 2 x 10  Therapeutic Activity: Re-assessment to determine overall progress, educating patient on progress towards goals.   [facility] Adult PT Treatment:                                                DATE: 05/09/22 Therapeutic Exercise: AROM Lower trap wall slide to lift off - tactile cues for  shoulder hike Mirror feedback bilateral shoulder flexion-working on decreasing shoulder hike- used wall and PPT to reduce lumbar extension.  Yellow band row, IR, ER- pt required cues to correctly perform ER x 10-15 reps each Standing AAROM shoulder ext with dowel Standing AAROM shoulder IR with dowel.  Manual Therapy: PROM flex, abdct IR and ER in scap plane  PATIENT EDUCATION: Education details: HEP Person educated: Patient Education method: Explanation Education comprehension: verbalized understanding   HOME EXERCISE PROGRAM: Access Code: [id number]      ASSESSMENT: CLINICAL IMPRESSION: Patient tolerated therapy well with no adverse effects. Therapy focused primarily on progressing shoulder motion and strength. She does continue to compensate with shoulder elevation but overall does seem to be progressing well with therapy. She does demonstrate muscular fatigue with shoulder strengthening but does not report any increase in pain. No changes to HEP, she was instructed to use a mirror for visual feedback to avoid shrug. Patient would benefit from continued skilled PT to progress her mobility and active motion to maximize her functional ability.    OBJECTIVE IMPAIRMENTS: decreased activity tolerance, decreased ROM, decreased strength, postural dysfunction, and pain.    ACTIVITY LIMITATIONS: carrying, lifting, bathing, dressing, reach over head, and hygiene/grooming   PARTICIPATION LIMITATIONS: meal prep, cleaning, driving, shopping, and occupation   PERSONAL FACTORS: Fitness, Past/current experiences, and Time since onset of injury/illness/exacerbation are also affecting patient's functional outcome.      GOALS: Goals reviewed with patient? Yes   SHORT TERM GOALS: Target date: 04/18/2022   Patient will be I with initial HEP in order to progress with  therapy. Baseline: HEP provided at eval Goal status: met    2.  Patient will demonstrate right shoulder PROM grossly WFL in order to  indicate improved right shoulder mobility Baseline: patient demonstrates limitations with right shoulder PROM in all planes (see above) 05/09/22: improved : see chart Goal status: met    3.  Patient will demonstrate right shoulder AROM elevation to 90 deg without shrug compensation to improve shoulder mechanics with raising arm and reduce shoulder pain with bathing and dressing  Baseline: patient demonstrates significant shrug with active shoulder elevation 05/09/22: can perform with mirror feedback Goal status: met    LONG TERM GOALS: Target date: 07/02/22   Patient will be I with final HEP to maintain progress from PT. Baseline: HEP provided at eval 05/20/2022: progressing Goal status: partially met   2.  Patient will report QuickDASH </= 10% disability in order to indicate improved functional ability using the right arm Baseline: 34.1% disability 05/09/22: 18%  05/18/22: Quick DASH: 4.5% disability  Goal status: met   3.  Patient will demonstrate right shoulder AROM grossly WFL in order to normalize overhead reach and allow for ability to perform light household tasks without limitation  Baseline: patient demonstrates limitations in right shoulder AROM in all planes (see above) 05/20/2022:  Goal status: partially met   4.  Patient will demonstrate right rotator cuff strength >/= 4/5 MMT in order to improve lifting, carrying, and performing heavier household tasks without pain or limitation Baseline: not assessed at eval 05/20/2022: grossly 3-/5 MMT Goal status: partially met      PLAN: PT FREQUENCY: 1-2x/week   PT DURATION: 6 weeks    PLANNED INTERVENTIONS: Therapeutic exercises, Therapeutic activity, Neuromuscular re-education, Balance training, Gait training, Patient/Family education, Self Care, Joint mobilization, Joint manipulation, Aquatic Therapy, Dry Needling, Spinal manipulation, Spinal mobilization, Cryotherapy, Moist heat, Taping, Manual therapy, and Re-evaluation   PLAN  FOR NEXT SESSION:  review HEP and progress PRN, manual/PROM for right shoulder motion, AAROM for all planes of shoulder motion, progress AROM supine/inclined/upright for elevation    Rosana Hoes, PT, DPT, LAT, ATC 05/23/22  12:41 PM Phone: [phone] Fax: [phone]

## 2022-05-23 ENCOUNTER — Ambulatory Visit: Payer: Medicaid Other | Admitting: Physical Therapy

## 2022-05-23 ENCOUNTER — Encounter: Payer: Self-pay | Admitting: Physical Therapy

## 2022-05-23 ENCOUNTER — Other Ambulatory Visit: Payer: Self-pay

## 2022-05-23 DIAGNOSIS — M6281 Muscle weakness (generalized): Secondary | ICD-10-CM | POA: Diagnosis not present

## 2022-05-23 DIAGNOSIS — M25511 Pain in right shoulder: Secondary | ICD-10-CM | POA: Diagnosis not present

## 2022-05-23 DIAGNOSIS — G8929 Other chronic pain: Secondary | ICD-10-CM | POA: Diagnosis not present

## 2022-05-25 ENCOUNTER — Ambulatory Visit: Payer: Medicaid Other | Admitting: Physical Therapy

## 2022-05-27 ENCOUNTER — Ambulatory Visit: Payer: Medicaid Other | Admitting: Physical Therapy

## 2022-05-30 NOTE — Therapy (Signed)
OUTPATIENT PHYSICAL THERAPY TREATMENT   Patient Name: Theresa Banks MRN: [id number] DOB:1980/07/13, 42 y.o., female Today's Date: 06/01/2022  PCP: None listed REFERRING PROVIDER: Cammy Copa, MD   END OF SESSION:   PT End of Session - 06/01/22 0853     Visit Number 14    Number of Visits 23    Date for PT Re-Evaluation 07/02/22    Authorization Type [employer]    Authorization Time Period 05/20/2022 - 06/18/2022    Authorization - Visit Number 2    Authorization - Number of Visits 4    PT Start Time 0852    PT Stop Time 0930    PT Time Calculation (min) 38 min    Activity Tolerance Patient tolerated treatment well    Behavior During Therapy [facility] for tasks assessed/performed                        Past Medical History:  Diagnosis Date   Anemia    Headache    Past Surgical History:  Procedure Laterality Date   BICEPT TENODESIS Right 02/03/2022   Procedure: BICEPS TENODESIS;  Surgeon: Cammy Copa, MD;  Location: [facility] OR;  Service: Orthopedics;  Laterality: Right;   HAND DEBRIDEMENT Right    SHOULDER ARTHROSCOPY WITH ROTATOR CUFF REPAIR AND SUBACROMIAL DECOMPRESSION Right 02/03/2022   Procedure: RIGHT SHOULDER ARTHROSCOPY, DEBRIDEMENT, MINI OPEN ROTATOR CUFF TEAR REPAIR;  Surgeon: Cammy Copa, MD;  Location: [facility] OR;  Service: Orthopedics;  Laterality: Right;   Patient Active Problem List   Diagnosis Date Noted   Traumatic complete tear of right rotator cuff 02/06/2022   Subluxation of tendon of long head of biceps 02/06/2022   Labral tear of shoulder, right, sequela 11/16/2021   Adhesive capsulitis of right shoulder 11/16/2021   Shoulder dislocation, right, initial encounter 09/08/2021   Indication for care in labor and delivery, antepartum 05/17/2016   Encounter for supervision of other normal pregnancy, third trimester 05/17/2016    REFERRING DIAG: S/P right rotator cuff repair  THERAPY DIAG:  Chronic right  shoulder pain  Muscle weakness (generalized)  Rationale for Evaluation and Treatment Rehabilitation  PERTINENT HISTORY: DOS 02/03/2022, see above  PRECAUTIONS: Shoulder    SUBJECTIVE:                                                                                                                                                                                     SUBJECTIVE STATEMENT:  Patient reports she is good. Shoulder is doing ok, no new issues. She has been working on her exercises.  PAIN:  Are you having pain? No   OBJECTIVE: (objective  measures completed at initial evaluation unless otherwise dated) PATIENT SURVEYS:  Quick Dash 34.1% disability 05/09/22 QUICK DASH 18% disability 05/18/22: Quick DASH: 4.5% disability    POSTURE: Rounded shoulder posture   UPPER EXTREMITY ROM:    Active ROM Right eval Left eval Rt 04/01/22 Rt 04/07/22 Rt 04/15/22  04/18/22 05/09/22 AROM/PROM 05/18/22 AROM/PROM Rt 05/20/2022  Shoulder flexion 90 AROM 110 PROM 165 AROM 130 PROM 140 PROM 140 PROM 105 AROM 140 PROM 78 AROM without shrug 130/148 172 (supine); 175; AROM in standing shoulder shrug present past 120  130 Standing (shrug > 120)  Shoulder extension             Shoulder abduction 45 AROM 70 PROM      96 PROM 122/130 140 (supine)/170 120 standing  Shoulder adduction             Shoulder internal rotation       L1  Reach L1/50 55; 70   Shoulder external rotation 40 PROM      55 PROM  Reach T2/60 60; 71 60  Elbow flexion             Elbow extension             Wrist flexion             Wrist extension             Wrist ulnar deviation             Wrist radial deviation             Wrist pronation             Wrist supination             (Blank rows = not tested)   Patient demonstrates significant shrug with active shoulder elevation   UPPER EXTREMITY MMT:   MMT Right 04/28/22 05/18/22 Right  Right 05/20/2022 Rt 06/01/22  Shoulder flexion 2  3- 3- 3  Shoulder extension        Shoulder abduction 2  3- 3-   Shoulder adduction       Shoulder internal rotation   5    Shoulder external rotation  3 4- 4- 4  Middle trapezius       Lower trapezius       Elbow flexion       Elbow extension       Wrist flexion       Wrist extension       Wrist ulnar deviation       Wrist radial deviation       Wrist pronation       Wrist supination       Grip strength (lbs)       (Blank rows = not tested)   JOINT MOBILITY TESTING:  Not assessed   PALPATION:  Non tender to palpation               TODAY'S TREATMENT:       [facility] Adult PT Treatment:                                                DATE: 06/01/22 Therapeutic Exercise: UBE L3 x 4 min while taking subjective Incline 60 deg shoulder flexion with 2# 3 x 10 Sidelying shoulder abduction with 4# 2 x 10 Sidelying shoulder  ER with 4# 2 x 10 Row with black 2 x 15 Step back stretch at counter x 10 Standing ER/IR with red 2 x 15 each Seated ER at 90 deg abd with elbow propped with 2# 2 x 10 Standing scaption to 90 deg with 1# 2 x 10 Standing single arm scaption with yellow for HEP demo   [facility] Adult PT Treatment:                                                DATE: 05/23/22 Therapeutic Exercise: Incline 60 deg shoulder flexion with x 10, 1# 2 x 10, with 2# x 10 Sidelying shoulder abduction with 3# 2 x 10 Sidelying shoulder ER with 3# 2 x 10 Row with blue 2 x 15 Extension with yellow 2 x 15 Wall slide x 10 Standing ER with red 2 x 15 each Seated ER at 70 deg abd with elbow propped with 2# 2 x 15 Standing scaption to 90 deg with 1# 2 x 10  [facility] Adult PT Treatment:                                                DATE: 05/20/22 Therapeutic Exercise: Supine dowel flexion 3# 2 x 10 Supine dowel serratus press 3# 2 x 10 Incline 60 deg shoulder flexion with 1# 2 x 10, with 2# x 10 Sidelying shoulder abduction with 3# 2 x 10 Standing ER / IR with red 2 x 15 each Wall slide x 10 Standing scaption to 90 deg with 1# 2 x  10 Row with blue 2 x 15  [facility] Adult PT Treatment:                                                DATE: 05/18/22 Therapeutic Exercise: UBE level 3 x 2 min each fwd/bwd Sleeper stretch 2 x 30 sec Seated shoulder flexion AROM to 120 degrees x 10  Shoulder flexion inclined 50 degrees 2# 2 x 10  Resisted shoulder ER yellow band 2 x 10  Therapeutic Activity: Re-assessment to determine overall progress, educating patient on progress towards goals.   PATIENT EDUCATION: Education details: HEP Person educated: Patient Education method: Explanation Education comprehension: verbalized understanding   HOME EXERCISE PROGRAM: Access Code: [email or web address]      ASSESSMENT: CLINICAL IMPRESSION: Patient tolerated therapy well with no adverse effects. Therapy focused primarily on progressing rotator cuff strengthening with good tolerance. She does demonstrate an improvement in her shoulder strength this visit but continues with gross weakness of the right shoulder. She does exhibit a slight shrug of the right shoulder with elevation but this continues to improve and is likely a result of shoulder weakness. Updated HEP to progress strengthening. Patient would benefit from continued skilled PT to progress her mobility and active motion to maximize her functional ability.   OBJECTIVE IMPAIRMENTS: decreased activity tolerance, decreased ROM, decreased strength, postural dysfunction, and pain.    ACTIVITY LIMITATIONS: carrying, lifting, bathing, dressing, reach over head, and hygiene/grooming   PARTICIPATION LIMITATIONS: meal prep, cleaning, driving, shopping, and occupation   PERSONAL FACTORS: Fitness, Past/current  experiences, and Time since onset of injury/illness/exacerbation are also affecting patient's functional outcome.      GOALS: Goals reviewed with patient? Yes   SHORT TERM GOALS: Target date: 04/18/2022   Patient will be I with initial HEP in order to progress with therapy. Baseline: HEP provided  at eval Goal status: met    2.  Patient will demonstrate right shoulder PROM grossly WFL in order to indicate improved right shoulder mobility Baseline: patient demonstrates limitations with right shoulder PROM in all planes (see above) 05/09/22: improved : see chart Goal status: met    3.  Patient will demonstrate right shoulder AROM elevation to 90 deg without shrug compensation to improve shoulder mechanics with raising arm and reduce shoulder pain with bathing and dressing  Baseline: patient demonstrates significant shrug with active shoulder elevation 05/09/22: can perform with mirror feedback Goal status: met    LONG TERM GOALS: Target date: 07/02/22   Patient will be I with final HEP to maintain progress from PT. Baseline: HEP provided at eval 05/20/2022: progressing Goal status: partially met   2.  Patient will report QuickDASH </= 10% disability in order to indicate improved functional ability using the right arm Baseline: 34.1% disability 05/09/22: 18%  05/18/22: Quick DASH: 4.5% disability  Goal status: met   3.  Patient will demonstrate right shoulder AROM grossly WFL in order to normalize overhead reach and allow for ability to perform light household tasks without limitation  Baseline: patient demonstrates limitations in right shoulder AROM in all planes (see above) 05/20/2022:  Goal status: partially met   4.  Patient will demonstrate right rotator cuff strength >/= 4/5 MMT in order to improve lifting, carrying, and performing heavier household tasks without pain or limitation Baseline: not assessed at eval 05/20/2022: grossly 3-/5 MMT Goal status: partially met      PLAN: PT FREQUENCY: 1-2x/week   PT DURATION: 6 weeks    PLANNED INTERVENTIONS: Therapeutic exercises, Therapeutic activity, Neuromuscular re-education, Balance training, Gait training, Patient/Family education, Self Care, Joint mobilization, Joint manipulation, Aquatic Therapy, Dry Needling, Spinal  manipulation, Spinal mobilization, Cryotherapy, Moist heat, Taping, Manual therapy, and Re-evaluation   PLAN FOR NEXT SESSION:  review HEP and progress PRN, manual/PROM for right shoulder motion, AAROM for all planes of shoulder motion, progress AROM supine/inclined/upright for elevation    Rosana Hoes, PT, DPT, LAT, ATC 06/01/22  9:36 AM Phone: [phone] Fax: [phone]

## 2022-06-01 ENCOUNTER — Encounter: Payer: Self-pay | Admitting: Physical Therapy

## 2022-06-01 ENCOUNTER — Other Ambulatory Visit: Payer: Self-pay

## 2022-06-01 ENCOUNTER — Ambulatory Visit: Payer: Medicaid Other | Admitting: Physical Therapy

## 2022-06-01 DIAGNOSIS — M6281 Muscle weakness (generalized): Secondary | ICD-10-CM | POA: Diagnosis not present

## 2022-06-01 DIAGNOSIS — G8929 Other chronic pain: Secondary | ICD-10-CM

## 2022-06-01 DIAGNOSIS — M25511 Pain in right shoulder: Secondary | ICD-10-CM | POA: Diagnosis not present

## 2022-06-01 NOTE — Patient Instructions (Signed)
Access Code: [email or web address] URL: [email or web address] Date: 06/01/2022 Prepared by: Rosana Hoes  Exercises - Supine Shoulder Flexion Extension AAROM with Dowel  - 2 x daily - 2 sets - 10 reps - Supine Shoulder External Rotation with Dowel  - 2 x daily - 10 reps - 5 seconds hold - Sidelying Shoulder External Rotation  - 1 x daily - 2 sets - 10 reps - Sidelying Shoulder Abduction Palm Forward  - 1 x daily - 2 sets - 10 reps - Seated Shoulder Flexion Towel Slide at Table Top  - 2 x daily - 10 reps - 10 seconds hold - Standing Shoulder External Rotation Stretch in Doorway  - 2 x daily - 10 reps - 10 seconds hold - Standing shoulder flexion wall slides  - 2 x daily - 10 reps - Standing Shoulder Scaption  - 1 x daily - 2 sets - 10 reps - Shoulder External Rotation with Anchored Resistance  - 1 x daily - 2 sets - 15 reps - Shoulder Internal Rotation with Resistance  - 1 x daily - 2 sets - 15 reps - Standing Single Arm Row with Resistance Thumb Up  - 1 x daily - 2 sets - 15 reps - Single Arm Scaption with Resistance  - 1 x daily - 3 sets - 10 reps

## 2022-06-03 ENCOUNTER — Ambulatory Visit: Payer: Medicaid Other | Admitting: Physical Therapy

## 2022-06-07 NOTE — Therapy (Addendum)
OUTPATIENT PHYSICAL THERAPY TREATMENT  DISCHARGE   Patient Name: Theresa Banks MRN: [id number] DOB:06/13/1980, 42 y.o., female Today's Date: 06/08/2022  PCP: None listed REFERRING PROVIDER: Cammy Copa, MD   END OF SESSION:   PT End of Session - 06/08/22 1027     Visit Number 15    Number of Visits 23    Date for PT Re-Evaluation 07/02/22    Authorization Type [employer]    Authorization Time Period 05/20/2022 - 06/18/2022    Authorization - Visit Number 3    Authorization - Number of Visits 4    PT Start Time 1024    PT Stop Time 1103    PT Time Calculation (min) 39 min    Activity Tolerance Patient tolerated treatment well    Behavior During Therapy [facility] for tasks assessed/performed                         Past Medical History:  Diagnosis Date   Anemia    Headache    Past Surgical History:  Procedure Laterality Date   BICEPT TENODESIS Right 02/03/2022   Procedure: BICEPS TENODESIS;  Surgeon: Cammy Copa, MD;  Location: [facility] OR;  Service: Orthopedics;  Laterality: Right;   HAND DEBRIDEMENT Right    SHOULDER ARTHROSCOPY WITH ROTATOR CUFF REPAIR AND SUBACROMIAL DECOMPRESSION Right 02/03/2022   Procedure: RIGHT SHOULDER ARTHROSCOPY, DEBRIDEMENT, MINI OPEN ROTATOR CUFF TEAR REPAIR;  Surgeon: Cammy Copa, MD;  Location: [facility] OR;  Service: Orthopedics;  Laterality: Right;   Patient Active Problem List   Diagnosis Date Noted   Traumatic complete tear of right rotator cuff 02/06/2022   Subluxation of tendon of long head of biceps 02/06/2022   Labral tear of shoulder, right, sequela 11/16/2021   Adhesive capsulitis of right shoulder 11/16/2021   Shoulder dislocation, right, initial encounter 09/08/2021   Indication for care in labor and delivery, antepartum 05/17/2016   Encounter for supervision of other normal pregnancy, third trimester 05/17/2016    REFERRING DIAG: S/P right rotator cuff repair  THERAPY DIAG:   Chronic right shoulder pain  Muscle weakness (generalized)  Rationale for Evaluation and Treatment Rehabilitation  PERTINENT HISTORY: DOS 02/03/2022, see above  PRECAUTIONS: Shoulder    SUBJECTIVE:                                                                                                                                                                                     SUBJECTIVE STATEMENT:  Patient reports she hasn't had any problems with her shoulder.  PAIN:  Are you having pain? No   OBJECTIVE: (objective measures completed at initial evaluation unless  otherwise dated) PATIENT SURVEYS:  Quick Dash 34.1% disability 05/09/22 QUICK DASH 18% disability 05/18/22: Quick DASH: 4.5% disability   POSTURE: Rounded shoulder posture   UPPER EXTREMITY ROM:    Active ROM Right eval Left eval Rt 04/01/22 Rt 04/07/22 Rt 04/15/22  04/18/22 05/09/22 AROM/PROM 05/18/22 AROM/PROM Rt 05/20/2022  Shoulder flexion 90 AROM 110 PROM 165 AROM 130 PROM 140 PROM 140 PROM 105 AROM 140 PROM 78 AROM without shrug 130/148 172 (supine); 175; AROM in standing shoulder shrug present past 120  130 Standing (shrug > 120)  Shoulder extension             Shoulder abduction 45 AROM 70 PROM      96 PROM 122/130 140 (supine)/170 120 standing  Shoulder adduction             Shoulder internal rotation       L1  Reach L1/50 55; 70   Shoulder external rotation 40 PROM      55 PROM  Reach T2/60 60; 71 60  Elbow flexion             Elbow extension             Wrist flexion             Wrist extension             Wrist ulnar deviation             Wrist radial deviation             Wrist pronation             Wrist supination             (Blank rows = not tested)   Patient demonstrates significant shrug with active shoulder elevation   UPPER EXTREMITY MMT:   MMT Right 04/28/22 05/18/22 Right  Right 05/20/2022 Rt 06/01/22  Shoulder flexion 2  3- 3- 3  Shoulder extension       Shoulder abduction 2   3- 3-   Shoulder adduction       Shoulder internal rotation   5    Shoulder external rotation  3 4- 4- 4  Middle trapezius       Lower trapezius       Elbow flexion       Elbow extension       Wrist flexion       Wrist extension       Wrist ulnar deviation       Wrist radial deviation       Wrist pronation       Wrist supination       Grip strength (lbs)       (Blank rows = not tested)   JOINT MOBILITY TESTING:  Not assessed   PALPATION:  Non tender to palpation               TODAY'S TREATMENT:       [facility] Adult PT Treatment:                                                DATE: 06/08/22 Therapeutic Exercise: UBE L3 x 4 min (fwd/bwd) while taking subjective Step back stretch at counter x 5 with 10 sec hold Standing wall slide 2 x 10 Standing ER/IR with red 3 x 15 each Supine  serratus punch 5# 2 x 15 Supine horizontal abduction with green 2 x 15 Sidelying shoulder abduction with 4# 2 x 10 Row with black 2 x 20 Standing scaption to 90 deg with 1# 3 x 10   [facility] Adult PT Treatment:                                                DATE: 06/01/22 Therapeutic Exercise: UBE L3 x 4 min while taking subjective Incline 60 deg shoulder flexion with 2# 3 x 10 Sidelying shoulder abduction with 4# 2 x 10 Sidelying shoulder ER with 4# 2 x 10 Row with black 2 x 15 Step back stretch at counter x 10 Standing ER/IR with red 2 x 15 each Seated ER at 90 deg abd with elbow propped with 2# 2 x 10 Standing scaption to 90 deg with 1# 2 x 10 Standing single arm scaption with yellow for HEP demo  [facility] Adult PT Treatment:                                                DATE: 05/23/22 Therapeutic Exercise: Incline 60 deg shoulder flexion with x 10, 1# 2 x 10, with 2# x 10 Sidelying shoulder abduction with 3# 2 x 10 Sidelying shoulder ER with 3# 2 x 10 Row with blue 2 x 15 Extension with yellow 2 x 15 Wall slide x 10 Standing ER with red 2 x 15 each Seated ER at 70 deg abd with elbow propped  with 2# 2 x 15 Standing scaption to 90 deg with 1# 2 x 10  [facility] Adult PT Treatment:                                                DATE: 05/20/22 Therapeutic Exercise: Supine dowel flexion 3# 2 x 10 Supine dowel serratus press 3# 2 x 10 Incline 60 deg shoulder flexion with 1# 2 x 10, with 2# x 10 Sidelying shoulder abduction with 3# 2 x 10 Standing ER / IR with red 2 x 15 each Wall slide x 10 Standing scaption to 90 deg with 1# 2 x 10 Row with blue 2 x 15  PATIENT EDUCATION: Education details: HEP Person educated: Patient Education method: Explanation Education comprehension: verbalized understanding   HOME EXERCISE PROGRAM: Access Code: [email or web address]      ASSESSMENT: CLINICAL IMPRESSION: Patient tolerated therapy well with no adverse effects. Therapy focused primarily on strengthening for the right rotator cuff and periscapular region with good tolerance. She seems to be progressing well with her exercises but does continue to exhibit gross strength deficit of the right shoulder with limitations in her motion at end range flexion. She is able to correct shrug when raising her arm to 90 deg but does continue to have shrug to reach end range active flexion. She denies any pain with therapy but does exhibit muscular fatigue. No changes to HEP this visit. Patient would benefit from continued skilled PT to progress her mobility and active motion to maximize her functional ability.   OBJECTIVE IMPAIRMENTS: decreased activity tolerance, decreased ROM, decreased  strength, postural dysfunction, and pain.    ACTIVITY LIMITATIONS: carrying, lifting, bathing, dressing, reach over head, and hygiene/grooming   PARTICIPATION LIMITATIONS: meal prep, cleaning, driving, shopping, and occupation   PERSONAL FACTORS: Fitness, Past/current experiences, and Time since onset of injury/illness/exacerbation are also affecting patient's functional outcome.      GOALS: Goals reviewed with patient? Yes    SHORT TERM GOALS: Target date: 04/18/2022   Patient will be I with initial HEP in order to progress with therapy. Baseline: HEP provided at eval Goal status: met    2.  Patient will demonstrate right shoulder PROM grossly WFL in order to indicate improved right shoulder mobility Baseline: patient demonstrates limitations with right shoulder PROM in all planes (see above) 05/09/22: improved : see chart Goal status: met    3.  Patient will demonstrate right shoulder AROM elevation to 90 deg without shrug compensation to improve shoulder mechanics with raising arm and reduce shoulder pain with bathing and dressing  Baseline: patient demonstrates significant shrug with active shoulder elevation 05/09/22: can perform with mirror feedback Goal status: met    LONG TERM GOALS: Target date: 07/02/22   Patient will be I with final HEP to maintain progress from PT. Baseline: HEP provided at eval 05/20/2022: progressing Goal status: partially met   2.  Patient will report QuickDASH </= 10% disability in order to indicate improved functional ability using the right arm Baseline: 34.1% disability 05/09/22: 18%  05/18/22: Quick DASH: 4.5% disability  Goal status: met   3.  Patient will demonstrate right shoulder AROM grossly WFL in order to normalize overhead reach and allow for ability to perform light household tasks without limitation  Baseline: patient demonstrates limitations in right shoulder AROM in all planes (see above) 05/20/2022:  Goal status: partially met   4.  Patient will demonstrate right rotator cuff strength >/= 4/5 MMT in order to improve lifting, carrying, and performing heavier household tasks without pain or limitation Baseline: not assessed at eval 05/20/2022: grossly 3-/5 MMT Goal status: partially met      PLAN: PT FREQUENCY: 1-2x/week   PT DURATION: 6 weeks    PLANNED INTERVENTIONS: Therapeutic exercises, Therapeutic activity, Neuromuscular re-education, Balance  training, Gait training, Patient/Family education, Self Care, Joint mobilization, Joint manipulation, Aquatic Therapy, Dry Needling, Spinal manipulation, Spinal mobilization, Cryotherapy, Moist heat, Taping, Manual therapy, and Re-evaluation   PLAN FOR NEXT SESSION:  review HEP and progress PRN, manual/PROM for right shoulder motion, progress shoulder strengthening and end range mobility    Rosana Hoes, PT, DPT, LAT, ATC 06/08/22  11:09 AM Phone: [phone] Fax: [phone]     PHYSICAL THERAPY DISCHARGE SUMMARY  Visits from Start of Care: 15  Current functional level related to goals / functional outcomes: See above   Remaining deficits: See above   Education / Equipment: HEP   Patient agrees to discharge. Patient goals were partially met. Patient is being discharged due to not returning since the last visit.  Rosana Hoes, PT, DPT, LAT, ATC 07/21/22  3:54 PM Phone: [phone] Fax: [phone]

## 2022-06-08 ENCOUNTER — Other Ambulatory Visit: Payer: Self-pay

## 2022-06-08 ENCOUNTER — Encounter: Payer: Self-pay | Admitting: Physical Therapy

## 2022-06-08 ENCOUNTER — Ambulatory Visit: Payer: Medicaid Other | Admitting: Physical Therapy

## 2022-06-08 DIAGNOSIS — M6281 Muscle weakness (generalized): Secondary | ICD-10-CM

## 2022-06-08 DIAGNOSIS — G8929 Other chronic pain: Secondary | ICD-10-CM

## 2022-06-08 DIAGNOSIS — M25511 Pain in right shoulder: Secondary | ICD-10-CM | POA: Diagnosis not present

## 2022-06-10 ENCOUNTER — Ambulatory Visit: Payer: Medicaid Other | Admitting: Physical Therapy

## 2022-06-13 ENCOUNTER — Ambulatory Visit: Payer: Medicaid Other

## 2022-06-20 ENCOUNTER — Ambulatory Visit: Payer: Medicaid Other | Admitting: Physical Therapy

## 2022-06-27 ENCOUNTER — Ambulatory Visit: Payer: Medicaid Other | Attending: Orthopedic Surgery

## 2022-06-27 ENCOUNTER — Telehealth: Payer: Self-pay

## 2022-06-27 NOTE — Telephone Encounter (Signed)
Unable to leave voicemail regarding missed PT appointment.   Letitia Libra, PT, DPT, ATC 06/27/22 10:39 AM
